# Patient Record
Sex: Female | Born: 1980 | Race: White | Hispanic: No | Marital: Married | Smoking: Never smoker
Health system: Southern US, Community
[De-identification: ages and names within clinical notes are randomized; demographics above are authoritative.]

## PROBLEM LIST (undated history)

## (undated) ENCOUNTER — Emergency Department (HOSPITAL_COMMUNITY): Payer: Medicaid Other

## (undated) ENCOUNTER — Inpatient Hospital Stay (HOSPITAL_COMMUNITY): Payer: Self-pay

## (undated) DIAGNOSIS — O99810 Abnormal glucose complicating pregnancy: Secondary | ICD-10-CM

## (undated) DIAGNOSIS — O34219 Maternal care for unspecified type scar from previous cesarean delivery: Secondary | ICD-10-CM

## (undated) DIAGNOSIS — Z98891 History of uterine scar from previous surgery: Secondary | ICD-10-CM

## (undated) DIAGNOSIS — S92515A Nondisplaced fracture of proximal phalanx of left lesser toe(s), initial encounter for closed fracture: Secondary | ICD-10-CM

## (undated) DIAGNOSIS — O4690 Antepartum hemorrhage, unspecified, unspecified trimester: Secondary | ICD-10-CM

## (undated) DIAGNOSIS — D649 Anemia, unspecified: Secondary | ICD-10-CM

## (undated) DIAGNOSIS — O09529 Supervision of elderly multigravida, unspecified trimester: Secondary | ICD-10-CM

## (undated) HISTORY — DX: Nondisplaced fracture of proximal phalanx of left lesser toe(s), initial encounter for closed fracture: S92.515A

## (undated) HISTORY — DX: Maternal care for unspecified type scar from previous cesarean delivery: O34.219

## (undated) HISTORY — DX: Anemia, unspecified: D64.9

## (undated) HISTORY — DX: Abnormal glucose complicating pregnancy: O99.810

## (undated) HISTORY — DX: History of uterine scar from previous surgery: Z98.891

## (undated) HISTORY — DX: Antepartum hemorrhage, unspecified, unspecified trimester: O46.90

## (undated) HISTORY — DX: Supervision of elderly multigravida, unspecified trimester: O09.529

---

## 2005-07-14 ENCOUNTER — Emergency Department (HOSPITAL_COMMUNITY): Admission: EM | Admit: 2005-07-14 | Discharge: 2005-07-14 | Payer: Self-pay | Admitting: Emergency Medicine

## 2005-07-26 ENCOUNTER — Ambulatory Visit (HOSPITAL_COMMUNITY): Admission: RE | Admit: 2005-07-26 | Discharge: 2005-07-26 | Payer: Self-pay | Admitting: Obstetrics & Gynecology

## 2007-01-01 ENCOUNTER — Ambulatory Visit (HOSPITAL_COMMUNITY): Admission: RE | Admit: 2007-01-01 | Discharge: 2007-01-01 | Payer: Self-pay | Admitting: Obstetrics & Gynecology

## 2007-03-26 ENCOUNTER — Inpatient Hospital Stay (HOSPITAL_COMMUNITY): Admission: AD | Admit: 2007-03-26 | Discharge: 2007-03-30 | Payer: Self-pay | Admitting: Obstetrics & Gynecology

## 2009-10-16 ENCOUNTER — Emergency Department (HOSPITAL_COMMUNITY): Admission: EM | Admit: 2009-10-16 | Discharge: 2009-10-16 | Payer: Self-pay | Admitting: Emergency Medicine

## 2009-12-05 DIAGNOSIS — E559 Vitamin D deficiency, unspecified: Secondary | ICD-10-CM | POA: Insufficient documentation

## 2010-08-29 LAB — URINALYSIS, ROUTINE W REFLEX MICROSCOPIC
Glucose, UA: NEGATIVE mg/dL
Ketones, ur: NEGATIVE mg/dL
Nitrite: NEGATIVE
Protein, ur: NEGATIVE mg/dL
Specific Gravity, Urine: 1.003 — ABNORMAL LOW (ref 1.005–1.030)
Urobilinogen, UA: 0.2 mg/dL (ref 0.0–1.0)

## 2010-08-29 LAB — URINE MICROSCOPIC-ADD ON

## 2010-08-29 LAB — WET PREP, GENITAL
Trich, Wet Prep: NONE SEEN
Yeast Wet Prep HPF POC: NONE SEEN

## 2010-10-24 NOTE — H&P (Signed)
NAMEWELDA, Glenn             ACCOUNT NO.:  [id number]   MEDICAL RECORD NO.:  [id number]          PATIENT TYPE:  INP   LOCATION:  9169                          FACILITY:  [facility]   PHYSICIAN:  Roseanna Rainbow, M.D.DATE OF BIRTH:  1981/05/27   DATE OF ADMISSION:  03/26/2007  DATE OF DISCHARGE:                              HISTORY & PHYSICAL   CHIEF COMPLAINT:  The patient is a 30 year old para 0 with an estimated  date of confinement of April 02, 2007 with an intrauterine pregnancy  at 39 weeks with borderline glucose tolerance for induction of labor.   HISTORY OF PRESENT ILLNESS:  Please see the above. The 1-hour GCT was  elevated, and her initial passing CBG was 95. Two-hour postprandials  were in the 120 range.   ALLERGIES:  NO KNOWN DRUG ALLERGIES.     Prenatal screening chlamydia probe negative.  Urine culture and  sensitivity on August 22, 2006. pap smear negative, GC probe negative,  hematocrit 39.7, hemoglobin 12.7, HIV nonreactive, platelets 217,000,  blood type A+, RPR nonreactive, rubella immune, sickle cell negative.  Her OB risk factors please see the above urinary tract infection.  On  prenatal screening GBS negative on October 2.   PAST GYN HISTORY:  Noncontributory.   PAST MEDICAL HISTORY:  She denies past surgical history.   SOCIAL HISTORY:  She is homemaker.  She is married, living with her  spouse.  She Bowels was not given exam was significant history of  alcohol usage. Has no significant smoking history.  Denies illicit drug  use.   FAMILY HISTORY:  No major illnesses known.   PHYSICAL EXAMINATION:  VITAL SIGNS:  Stable.  Afebrile.  Fetal heart  tracing reassuring.  Tocodynamometer regular uterine contractions.  Sterile vaginal exam per the RN.   ASSESSMENTS:  Intrauterine pregnancy at 39 weeks. Borderline glucose  tolerance and fetal heart tracing consistent with fetal well-being.   PLAN:  Admission.  Two-stage induction of labor.      Roseanna Rainbow, M.D.  Electronically Signed     LAJ/MEDQ  D:  03/27/2007  T:  03/27/2007  Job:  [id number]

## 2010-10-24 NOTE — Op Note (Signed)
[name], Morgan Glenn             ACCOUNT NO.:  [id number]   MEDICAL RECORD NO.:  [id number]          PATIENT TYPE:  INP   LOCATION:  9134                          FACILITY:  [facility]   PHYSICIAN:  Roseanna Rainbow, M.D.DATE OF BIRTH:  [birth year]   DATE OF PROCEDURE:  03/26/2007  DATE OF DISCHARGE:                               OPERATIVE REPORT   PREOPERATIVE DIAGNOSIS:  Intrauterine pregnancy at term, arrest of  descent.   POSTOPERATIVE DIAGNOSIS:  Intrauterine pregnancy at term, arrest of  descent, persistent occiput posterior.   SURGEON:  Roseanna Rainbow, M.D.  Charles A. Clearance Coots, M.D.   ANESTHESIA:  Epidural.   ESTIMATED BLOOD LOSS:  600 mL.   FLUIDS:  2400 mL lactated Ringer's.   URINE OUTPUT:  200 mL blood tinged urine.   COMPLICATIONS:  None.   PROCEDURE:  The patient was taken to the operating room with an IV  running and an epidural catheter in situ.  She was placed in the dorsal  supine position with a leftward tilt and prepped and draped in the usual  sterile fashion.  After a time out had been completed, a Pfannenstiel  skin incision was then made with the scalpel and carried down to the  underlying fascia.  The fascia was nicked in the midline.  The fascial  incision was then extended bilaterally.  The superior aspect of the  fascial incision was tented up and the underlying rectus muscles  dissected off.  The inferior aspect of the fascial incision was  manipulated in a similar fashion.  The rectus muscles were separated in  the midline.  The parietal peritoneum was then entered bluntly.  This  incision was then extended superiorly and inferiorly with good  visualization of the bladder.  An Alexis retractor was then placed into  the incision.  The vesicouterine peritoneum was tented up and entered  sharply.  This incision was then extended bilaterally and the bladder  flap created bluntly.  The lower uterine segment was then incised in a  transverse  fashion with the scalpel.  This incision was then extended  bluntly.   The infant's head was then delivered atraumatically.  The oropharynx was  suctioned with bulb suction.  The cord was clamped and cut.  The infant  was handed off to the awaiting neonatologist.  The placenta was then  removed.  The intrauterine cavity was evacuated of any remaining  amniotic fluid, clots and debris with a moist laparotomy sponge.  The  uterine incision was then reapproximated in a running interlocking  fashion using 0 Monocryl.  A second suture of the same was then placed  as an imbricating layer.  Adequate hemostasis was noted.  The paracolic  gutters were then copiously irrigated.  The retractor was then removed.  The parietal peritoneum was reapproximated in a running fashion using 2-  0 Vicryl.  The fascia was closed in a running fashion using 0 Vicryl.  The skin was closed with staples.  At the close of the procedure, the  instrument and pack counts were said to be correct x2.  1 gram of  cephazolin had been given at cord clamp.  The patient was taken to the  PACU awake and in stable condition.      Roseanna Rainbow, M.D.  Electronically Signed     LAJ/MEDQ  D:  03/27/2007  T:  03/28/2007  Job:  [id number]

## 2010-10-24 NOTE — Discharge Summary (Signed)
Morgan Glenn, Morgan Glenn             ACCOUNT NO.:  [id number]   MEDICAL RECORD NO.:  [id number]          PATIENT TYPE:  INP   LOCATION:  9134                          [facility]ILITY:  [facility]   PHYSICIAN:  Charles A. Clearance Coots, M.D.DATE OF BIRTH:  [birth year]   DATE OF ADMISSION:  03/26/2007  DATE OF DISCHARGE:  03/30/2007                               DISCHARGE SUMMARY   ADMISSION DIAGNOSES:  1. [redacted] weeks gestation.  2. Borderline glucose tolerance.  3. Two-stage induction of labor.   DISCHARGE DIAGNOSES:  1. [redacted] weeks gestation.  2. Borderline glucose tolerance.  3. Two-stage induction of labor.  4. Status post primary low transverse cesarean section on March 27, 2007 for arrest of descent.  Viable female was delivered at 1851.      Apgars of 7 at 1 minute and 9 at 5 minutes, weight of 2948 grams,      length of 55 cm.  Mother and infant discharged home in good      condition.   REASON FOR ADMISSION:  30 year old para 0 with estimated date of  confinement of April 02, 2007 who presents for two-stage induction of  labor.  The patient had borderline glucose tolerance testing.  The one-  hour glucose tolerance level was elevated, and her initial fasting  capillary blood sugar was 95, and her 2-hour postprandials were in the  120 range.   PAST MEDICAL HISTORY:  Surgery none.  Illnesses none.   MEDICATIONS:  Prenatal vitamins.   ALLERGIES:  NO KNOWN DRUG ALLERGIES   SOCIAL HISTORY:  Married.  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  Negative history of tobacco, alcohol, or recreational drug use.   FAMILY HISTORY:  No major illnesses known.   PHYSICAL EXAMINATION:  GENERAL:  Well-nourished, well-developed female  in no acute distress.  VITAL SIGNS:  She is afebrile, vital signs were stable.  LUNGS:  Clear to auscultation bilaterally.  HEART:  Regular rate and rhythm.  ABDOMEN:  Gravid, nontender.  PELVIC:  Cervix long, closed, and vertex at -2 station.   ADMITTING LABORATORY  VALUES:  Hemoglobin 11.8, hematocrit 34.3, white  blood cell count 10,000, platelets 209,000.  Comprehensive metabolic  panel was within normal limits.  Urinalysis was within normal limits.   HOSPITAL COURSE:  The patient was admitted for two-stage induction of  labor.  She received Cervidil cervical ripening followed by Pitocin.  The patient progressed to full dilatation, but the vertex descended to  no more than a +1 station and made no further descent.  A decision was  made to proceed to cesarean section delivery for arrest of descent.  Primary low transverse cesarean section was performed on March 27, 2007.  There were no intraoperative complications.  Postoperative course  was uncomplicated.  The patient was discharged home on postop day #3 in  good condition.   DISCHARGE LABORATORY VALUES:  Hemoglobin 10.9, hematocrit 31.9, white  blood cell count 12,700, platelets 152,000.  Urine culture was negative.   DISCHARGE MEDICATIONS:  Tylox and ibuprofen was prescribed for pain.  Continue prenatal vitamins.   DISCHARGE  INSTRUCTIONS:  Routine written instructions were given for  discharge after cesarean section.  The patient is to call the office for  a followup appointment in 2 weeks.      Charles A. Clearance Coots, M.D.  Electronically Signed     CAH/MEDQ  D:  03/30/2007  T:  03/31/2007  Job:  [id number]

## 2010-11-27 ENCOUNTER — Emergency Department (HOSPITAL_COMMUNITY): Payer: Medicaid Other

## 2010-11-27 ENCOUNTER — Emergency Department (HOSPITAL_COMMUNITY)
Admission: EM | Admit: 2010-11-27 | Discharge: 2010-11-28 | Disposition: A | Discharge status: Home / Self Care | Payer: Medicaid Other | Attending: Emergency Medicine | Admitting: Emergency Medicine

## 2010-11-27 ENCOUNTER — Other Ambulatory Visit (HOSPITAL_COMMUNITY): Payer: Medicaid Other

## 2010-11-27 DIAGNOSIS — O209 Hemorrhage in early pregnancy, unspecified: Secondary | ICD-10-CM | POA: Insufficient documentation

## 2010-11-27 DIAGNOSIS — R1031 Right lower quadrant pain: Secondary | ICD-10-CM | POA: Insufficient documentation

## 2010-11-27 LAB — URINALYSIS, ROUTINE W REFLEX MICROSCOPIC
Bilirubin Urine: NEGATIVE
Specific Gravity, Urine: 1.005 (ref 1.005–1.030)
pH: 6 (ref 5.0–8.0)

## 2010-11-27 LAB — POCT I-STAT, CHEM 8
BUN: 11 mg/dL (ref 6–23)
Chloride: 103 mEq/L (ref 96–112)
Glucose, Bld: 142 mg/dL — ABNORMAL HIGH (ref 70–99)
HCT: 39 % (ref 36.0–46.0)
Potassium: 4.1 mEq/L (ref 3.5–5.1)
Sodium: 138 mEq/L (ref 135–145)

## 2010-11-27 LAB — DIFFERENTIAL
Basophils Relative: 0 % (ref 0–1)
Lymphs Abs: 3.4 10*3/uL (ref 0.7–4.0)
Monocytes Absolute: 0.8 10*3/uL (ref 0.1–1.0)
Monocytes Relative: 7 % (ref 3–12)
Neutrophils Relative %: 59 % (ref 43–77)

## 2010-11-27 LAB — CBC
HCT: 36.7 % (ref 36.0–46.0)
MCHC: 33.5 g/dL (ref 30.0–36.0)
MCV: 84.8 fL (ref 78.0–100.0)
RDW: 13.4 % (ref 11.5–15.5)

## 2010-11-27 LAB — URINE MICROSCOPIC-ADD ON

## 2010-11-27 LAB — WET PREP, GENITAL: Yeast Wet Prep HPF POC: NONE SEEN

## 2010-11-28 LAB — GC/CHLAMYDIA PROBE AMP, GENITAL
Chlamydia, DNA Probe: NEGATIVE
GC Probe Amp, Genital: NEGATIVE

## 2010-11-28 LAB — HCG, QUANTITATIVE, PREGNANCY: hCG, Beta Chain, Quant, S: 1506 m[IU]/mL — ABNORMAL HIGH (ref <5)

## 2010-11-30 ENCOUNTER — Inpatient Hospital Stay (HOSPITAL_COMMUNITY)
Admission: AD | Admit: 2010-11-30 | Discharge: 2010-11-30 | Disposition: A | Discharge status: Home / Self Care | Payer: Medicaid Other | Source: Ambulatory Visit | Attending: Obstetrics & Gynecology | Admitting: Obstetrics & Gynecology

## 2010-11-30 DIAGNOSIS — O3680X0 Pregnancy with inconclusive fetal viability, not applicable or unspecified: Secondary | ICD-10-CM

## 2010-11-30 DIAGNOSIS — Z3480 Encounter for supervision of other normal pregnancy, unspecified trimester: Secondary | ICD-10-CM | POA: Insufficient documentation

## 2010-12-12 ENCOUNTER — Ambulatory Visit (HOSPITAL_COMMUNITY)
Admission: RE | Admit: 2010-12-12 | Discharge: 2010-12-12 | Disposition: A | Discharge status: Home / Self Care | Payer: Medicaid Other | Source: Ambulatory Visit | Attending: Obstetrics & Gynecology | Admitting: Obstetrics & Gynecology

## 2010-12-12 ENCOUNTER — Ambulatory Visit (HOSPITAL_COMMUNITY): Payer: Medicaid Other

## 2010-12-12 DIAGNOSIS — O99891 Other specified diseases and conditions complicating pregnancy: Secondary | ICD-10-CM | POA: Insufficient documentation

## 2010-12-12 DIAGNOSIS — R1031 Right lower quadrant pain: Secondary | ICD-10-CM | POA: Insufficient documentation

## 2010-12-12 DIAGNOSIS — Z3689 Encounter for other specified antenatal screening: Secondary | ICD-10-CM | POA: Insufficient documentation

## 2011-03-21 LAB — CBC
HCT: 31.9 — ABNORMAL LOW
Hemoglobin: 10.9 — ABNORMAL LOW
Hemoglobin: 12.7
Hemoglobin: 12.8
MCHC: 33.5
MCHC: 33.6
MCHC: 34.4
MCV: 84.6
MCV: 84.7
Platelets: 209
RBC: 3.77 — ABNORMAL LOW
RBC: 4.44
RDW: 16.1 — ABNORMAL HIGH
RDW: 16.4 — ABNORMAL HIGH
WBC: 12.7 — ABNORMAL HIGH
WBC: 14.5 — ABNORMAL HIGH

## 2011-03-21 LAB — URINE CULTURE
Colony Count: NO GROWTH
Culture: NO GROWTH

## 2011-03-21 LAB — URINALYSIS, ROUTINE W REFLEX MICROSCOPIC
Glucose, UA: 100 — AB
Ketones, ur: NEGATIVE
Protein, ur: NEGATIVE
pH: 6

## 2011-03-21 LAB — PLATELET COUNT: Platelets: 173

## 2011-03-21 LAB — COMPREHENSIVE METABOLIC PANEL
ALT: 12
Calcium: 8.7
Creatinine, Ser: 0.5
GFR calc non Af Amer: >60
Glucose, Bld: 107 — ABNORMAL HIGH
Sodium: 137
Total Protein: 6.1

## 2011-03-21 LAB — URINE MICROSCOPIC-ADD ON: RBC / HPF: NONE SEEN

## 2011-03-21 LAB — LACTATE DEHYDROGENASE: LDH: 144

## 2011-03-21 LAB — URIC ACID: Uric Acid, Serum: 5

## 2011-07-04 ENCOUNTER — Inpatient Hospital Stay (HOSPITAL_COMMUNITY): Payer: Medicaid Other | Admitting: Anesthesiology

## 2011-07-04 ENCOUNTER — Encounter (HOSPITAL_COMMUNITY): Payer: Self-pay | Admitting: Anesthesiology

## 2011-07-04 ENCOUNTER — Encounter (HOSPITAL_COMMUNITY): Payer: Self-pay | Admitting: *Deleted

## 2011-07-04 ENCOUNTER — Inpatient Hospital Stay (HOSPITAL_COMMUNITY)
Admission: AD | Admit: 2011-07-04 | Discharge: 2011-07-06 | DRG: 774 | Disposition: A | Discharge status: Home / Self Care | Payer: Medicaid Other | Source: Ambulatory Visit | Attending: Obstetrics and Gynecology | Admitting: Obstetrics and Gynecology

## 2011-07-04 ENCOUNTER — Other Ambulatory Visit: Payer: Self-pay

## 2011-07-04 DIAGNOSIS — O368390 Maternal care for abnormalities of the fetal heart rate or rhythm, unspecified trimester, not applicable or unspecified: Secondary | ICD-10-CM | POA: Diagnosis not present

## 2011-07-04 DIAGNOSIS — O4290 Premature rupture of membranes, unspecified as to length of time between rupture and onset of labor, unspecified weeks of gestation: Secondary | ICD-10-CM | POA: Diagnosis present

## 2011-07-04 DIAGNOSIS — Z98891 History of uterine scar from previous surgery: Secondary | ICD-10-CM

## 2011-07-04 DIAGNOSIS — O4590 Premature separation of placenta, unspecified, unspecified trimester: Secondary | ICD-10-CM | POA: Diagnosis present

## 2011-07-04 DIAGNOSIS — Z3490 Encounter for supervision of normal pregnancy, unspecified, unspecified trimester: Secondary | ICD-10-CM

## 2011-07-04 DIAGNOSIS — O34219 Maternal care for unspecified type scar from previous cesarean delivery: Secondary | ICD-10-CM | POA: Diagnosis present

## 2011-07-04 LAB — RPR: RPR: NONREACTIVE

## 2011-07-04 LAB — STREP B DNA PROBE

## 2011-07-04 LAB — CBC
HCT: 35.1 % — ABNORMAL LOW (ref 36.0–46.0)
MCH: 30.1 pg (ref 26.0–34.0)
MCHC: 33.3 g/dL (ref 30.0–36.0)
MCV: 90.2 fL (ref 78.0–100.0)
Platelets: 155 10*3/uL (ref 150–400)
RDW: 14.5 % (ref 11.5–15.5)

## 2011-07-04 LAB — HEPATITIS B SURFACE ANTIGEN: Hepatitis B Surface Ag: NEGATIVE

## 2011-07-04 LAB — POCT FERN TEST: Fern Test: POSITIVE

## 2011-07-04 MED ORDER — SENNOSIDES-DOCUSATE SODIUM 8.6-50 MG PO TABS
2.0000 | ORAL_TABLET | Freq: Every day | ORAL | Status: DC
  Administered 2011-07-04 – 2011-07-05 (×2): 2 via ORAL

## 2011-07-04 MED ORDER — DEXTROSE 5 % IV SOLN
2.5000 10*6.[IU] | INTRAVENOUS | Status: DC
  Administered 2011-07-04 (×3): 2.5 10*6.[IU] via INTRAVENOUS
  Filled 2011-07-04 (×6): qty 2.5

## 2011-07-04 MED ORDER — ONDANSETRON HCL 4 MG/2ML IJ SOLN: 4.0000 mg | Freq: Four times a day (QID) | INTRAMUSCULAR | Status: DC | PRN

## 2011-07-04 MED ORDER — MEDROXYPROGESTERONE ACETATE 150 MG/ML IM SUSP: 150.0000 mg | INTRAMUSCULAR | Status: DC | PRN

## 2011-07-04 MED ORDER — IBUPROFEN 600 MG PO TABS: 600.0000 mg | ORAL_TABLET | Freq: Four times a day (QID) | ORAL | Status: DC | PRN

## 2011-07-04 MED ORDER — OXYCODONE-ACETAMINOPHEN 5-325 MG PO TABS: 2.0000 | ORAL_TABLET | ORAL | Status: DC | PRN

## 2011-07-04 MED ORDER — POLYSACCHARIDE IRON 150 MG PO CAPS
150.0000 mg | ORAL_CAPSULE | Freq: Two times a day (BID) | ORAL | Status: DC
  Administered 2011-07-05 – 2011-07-06 (×3): 150 mg via ORAL
  Filled 2011-07-04 (×4): qty 1

## 2011-07-04 MED ORDER — BENZOCAINE-MENTHOL 20-0.5 % EX AERO
INHALATION_SPRAY | CUTANEOUS | Status: AC
  Administered 2011-07-04: 1 via TOPICAL
  Filled 2011-07-04: qty 56

## 2011-07-04 MED ORDER — PENICILLIN G POTASSIUM 5000000 UNITS IJ SOLR
5.0000 10*6.[IU] | Freq: Once | INTRAVENOUS | Status: AC
  Administered 2011-07-04: 5 10*6.[IU] via INTRAVENOUS
  Filled 2011-07-04: qty 5

## 2011-07-04 MED ORDER — ZOLPIDEM TARTRATE 5 MG PO TABS: 5.0000 mg | ORAL_TABLET | Freq: Every evening | ORAL | Status: DC | PRN

## 2011-07-04 MED ORDER — EPHEDRINE 5 MG/ML INJ: 10.0000 mg | INTRAVENOUS | Status: DC | PRN

## 2011-07-04 MED ORDER — WITCH HAZEL-GLYCERIN EX PADS: 1.0000 "application " | MEDICATED_PAD | CUTANEOUS | Status: DC | PRN

## 2011-07-04 MED ORDER — ONDANSETRON HCL 4 MG/2ML IJ SOLN: 4.0000 mg | INTRAMUSCULAR | Status: DC | PRN

## 2011-07-04 MED ORDER — PRENATAL MULTIVITAMIN CH
1.0000 | ORAL_TABLET | Freq: Every day | ORAL | Status: DC
  Administered 2011-07-05 – 2011-07-06 (×2): 1 via ORAL
  Filled 2011-07-04 (×2): qty 1

## 2011-07-04 MED ORDER — ONDANSETRON HCL 4 MG PO TABS: 4.0000 mg | ORAL_TABLET | ORAL | Status: DC | PRN

## 2011-07-04 MED ORDER — METHYLERGONOVINE MALEATE 0.2 MG/ML IJ SOLN: 0.2000 mg | INTRAMUSCULAR | Status: DC | PRN

## 2011-07-04 MED ORDER — LIDOCAINE HCL (PF) 1 % IJ SOLN: 30.0000 mL | INTRAMUSCULAR | Status: DC | PRN

## 2011-07-04 MED ORDER — OXYTOCIN 10 UNIT/ML IJ SOLN: 10.0000 [IU] | Freq: Once | INTRAMUSCULAR | Status: DC

## 2011-07-04 MED ORDER — EPHEDRINE 5 MG/ML INJ
10.0000 mg | INTRAVENOUS | Status: DC | PRN
  Filled 2011-07-04: qty 4

## 2011-07-04 MED ORDER — MAGNESIUM HYDROXIDE 400 MG/5ML PO SUSP: 30.0000 mL | ORAL | Status: DC | PRN

## 2011-07-04 MED ORDER — PENICILLIN G POTASSIUM 5000000 UNITS IJ SOLR
2.5000 10*6.[IU] | INTRAVENOUS | Status: DC
  Filled 2011-07-04 (×4): qty 2.5

## 2011-07-04 MED ORDER — LANOLIN HYDROUS EX OINT: TOPICAL_OINTMENT | CUTANEOUS | Status: DC | PRN

## 2011-07-04 MED ORDER — METHYLERGONOVINE MALEATE 0.2 MG PO TABS: 0.2000 mg | ORAL_TABLET | ORAL | Status: DC | PRN

## 2011-07-04 MED ORDER — BENZOCAINE-MENTHOL 20-0.5 % EX AERO
1.0000 "application " | INHALATION_SPRAY | CUTANEOUS | Status: DC | PRN
  Administered 2011-07-04 – 2011-07-05 (×2): 1 via TOPICAL

## 2011-07-04 MED ORDER — LACTATED RINGERS IV SOLN
INTRAVENOUS | Status: DC
  Administered 2011-07-04 (×3): via INTRAVENOUS

## 2011-07-04 MED ORDER — FENTANYL 2.5 MCG/ML BUPIVACAINE 1/10 % EPIDURAL INFUSION (WH - ANES)
INTRAMUSCULAR | Status: DC | PRN
  Administered 2011-07-04: 13 mL/h via EPIDURAL

## 2011-07-04 MED ORDER — SIMETHICONE 80 MG PO CHEW: 80.0000 mg | CHEWABLE_TABLET | ORAL | Status: DC | PRN

## 2011-07-04 MED ORDER — TERBUTALINE SULFATE 1 MG/ML IJ SOLN: 0.2500 mg | Freq: Once | INTRAMUSCULAR | Status: DC | PRN

## 2011-07-04 MED ORDER — LACTATED RINGERS IV SOLN: 500.0000 mL | Freq: Once | INTRAVENOUS | Status: DC

## 2011-07-04 MED ORDER — OXYTOCIN 20 UNITS IN LACTATED RINGERS INFUSION - SIMPLE
1.0000 m[IU]/min | INTRAVENOUS | Status: DC
  Administered 2011-07-04: 2 m[IU]/min via INTRAVENOUS

## 2011-07-04 MED ORDER — ACETAMINOPHEN 325 MG PO TABS: 650.0000 mg | ORAL_TABLET | ORAL | Status: DC | PRN

## 2011-07-04 MED ORDER — OXYCODONE-ACETAMINOPHEN 5-325 MG PO TABS
1.0000 | ORAL_TABLET | ORAL | Status: DC | PRN
  Administered 2011-07-05: 1 via ORAL
  Filled 2011-07-04: qty 1

## 2011-07-04 MED ORDER — DIPHENHYDRAMINE HCL 50 MG/ML IJ SOLN: 12.5000 mg | INTRAMUSCULAR | Status: DC | PRN

## 2011-07-04 MED ORDER — LACTATED RINGERS IV SOLN
500.0000 mL | INTRAVENOUS | Status: DC | PRN
  Administered 2011-07-04: 1000 mL via INTRAVENOUS

## 2011-07-04 MED ORDER — SODIUM BICARBONATE 8.4 % IV SOLN
INTRAVENOUS | Status: DC | PRN
  Administered 2011-07-04: 4 mL via EPIDURAL

## 2011-07-04 MED ORDER — OXYTOCIN BOLUS FROM INFUSION
500.0000 mL | Freq: Once | INTRAVENOUS | Status: DC
  Filled 2011-07-04: qty 500

## 2011-07-04 MED ORDER — FENTANYL 2.5 MCG/ML BUPIVACAINE 1/10 % EPIDURAL INFUSION (WH - ANES)
14.0000 mL/h | INTRAMUSCULAR | Status: DC
  Filled 2011-07-04: qty 60

## 2011-07-04 MED ORDER — CITRIC ACID-SODIUM CITRATE 334-500 MG/5ML PO SOLN
30.0000 mL | ORAL | Status: DC | PRN
  Filled 2011-07-04: qty 15

## 2011-07-04 MED ORDER — IBUPROFEN 600 MG PO TABS
600.0000 mg | ORAL_TABLET | Freq: Four times a day (QID) | ORAL | Status: DC
  Administered 2011-07-04 – 2011-07-06 (×7): 600 mg via ORAL
  Filled 2011-07-04 (×7): qty 1

## 2011-07-04 MED ORDER — PHENYLEPHRINE 40 MCG/ML (10ML) SYRINGE FOR IV PUSH (FOR BLOOD PRESSURE SUPPORT)
80.0000 ug | PREFILLED_SYRINGE | INTRAVENOUS | Status: DC | PRN
  Filled 2011-07-04: qty 5

## 2011-07-04 MED ORDER — PHENYLEPHRINE 40 MCG/ML (10ML) SYRINGE FOR IV PUSH (FOR BLOOD PRESSURE SUPPORT): 80.0000 ug | PREFILLED_SYRINGE | INTRAVENOUS | Status: DC | PRN

## 2011-07-04 MED ORDER — OXYTOCIN 20 UNITS IN LACTATED RINGERS INFUSION - SIMPLE
125.0000 mL/h | Freq: Once | INTRAVENOUS | Status: DC
  Filled 2011-07-04: qty 1000

## 2011-07-04 MED ORDER — DIPHENHYDRAMINE HCL 25 MG PO CAPS: 25.0000 mg | ORAL_CAPSULE | Freq: Four times a day (QID) | ORAL | Status: DC | PRN

## 2011-07-04 MED ORDER — TETANUS-DIPHTH-ACELL PERTUSSIS 5-2.5-18.5 LF-MCG/0.5 IM SUSP
0.5000 mL | Freq: Once | INTRAMUSCULAR | Status: AC
Start: 2011-07-05 — End: 2011-07-05
  Administered 2011-07-05: 0.5 mL via INTRAMUSCULAR
  Filled 2011-07-04: qty 0.5

## 2011-07-04 MED ORDER — DIBUCAINE 1 % RE OINT: 1.0000 "application " | TOPICAL_OINTMENT | RECTAL | Status: DC | PRN

## 2011-07-04 NOTE — Progress Notes (Signed)
Morgan Glenn is a 31 y.o. G2P1001 at [redacted]w[redacted]d  admitted for PROM  Subjective: On arrival to room, pt resting on left side and husband at bedside.  Nothing to eat this morning.  No c/o's.  Does report some vaginal bleeding.  GFM.  Denies incision pain.    Objective: BP 103/59  Pulse 80  Temp(Src) 98.2 F (36.8 C) (Oral)  Resp 16  Ht 5\' 3"  (1.6 m)  Wt 68.947 kg (152 lb)  BMI 26.93 kg/m2      FHT:  FHR: 135 bpm, variability: moderate,  accelerations:  Present,  decelerations:  Absent UC:   irregular, every 5-7 minutes SVE:   Dilation: 2.5 Effacement (%): 60 Station: -3 Exam by:: H Rodriguez Aguinaldo CNM Could not determine presenting part, therefore bedside u/s done and Vtx noted.  Inlet adequate, but outlet questionable. Labs: Lab Results  Component Value Date   WBC 8.4 07/04/2011   HGB 11.7* 07/04/2011   HCT 35.1* 07/04/2011   MCV 90.2 07/04/2011   PLT 155 07/04/2011    Assessment / Plan: 1.  Preterm, Prelabor ROM at 36.4  2.  Desires TOLAC  3.  GBS unknown, but receiving PCN  4.  Lang barrier  5.  No cx change  6.  Blood show despite lack of cx change  Labor: latent Preeclampsia:  no signs or symptoms of toxicity Fetal Wellbeing:  Category I Pain Control:  Labor support without medications I/D:  n/a Anticipated MOD:  NSVD 1.  Rec'd Pitocin to induce labor.  R/B/A disc'd and pt agreeable to proceed.  2.  Desires epidural in labor.   3.  C/w MD prn. Ivett Luebbe H 07/04/2011, 9:37 AM

## 2011-07-04 NOTE — Anesthesia Preprocedure Evaluation (Signed)
Anesthesia Evaluation  Patient identified by MRN, date of birth, ID band Patient awake    Reviewed: Allergy & Precautions, H&P , Patient's Chart, lab work & pertinent test results  Airway Mallampati: II TM Distance: >3 FB Neck ROM: full    Dental  (+) Teeth Intact   Pulmonary  clear to auscultation        Cardiovascular regular Normal    Neuro/Psych    GI/Hepatic   Endo/Other  Diabetes mellitus-, Gestational  Renal/GU      Musculoskeletal   Abdominal   Peds  Hematology   Anesthesia Other Findings       Reproductive/Obstetrics (+) Pregnancy                          Anesthesia Physical Anesthesia Plan  ASA: III  Anesthesia Plan: Epidural   Post-op Pain Management:    Induction:   Airway Management Planned:   Additional Equipment:   Intra-op Plan:   Post-operative Plan:   Informed Consent: I have reviewed the patients History and Physical, chart, labs and discussed the procedure including the risks, benefits and alternatives for the proposed anesthesia with the patient or authorized representative who has indicated his/her understanding and acceptance.   Dental Advisory Given  Plan Discussed with:   Anesthesia Plan Comments: (Labs checked- platelets confirmed with RN in room. Fetal heart tracing, per RN, reported to be stable enough for sitting procedure. Discussed epidural, and patient consents to the procedure:  included risk of possible headache,backache, failed block, allergic reaction, and nerve injury. This patient was asked if she had any questions or concerns before the procedure started. )      Anesthesia Quick Evaluation  

## 2011-07-04 NOTE — Anesthesia Procedure Notes (Signed)

## 2011-07-04 NOTE — H&P (Signed)
Morgan Glenn is a 31 y.o. female presenting for ROM. Arrived via EMS with ROM at 0100 with clear fluid, has had irregular ctx since than. Denies VB, +FM. Hx of prior c-section, desires TOLAC.  Pregnancy significant for:  1. Prev c/s desires TOLAC 2. 1st trimester blding 3. Language barrier  HPI: pt began prenatal care at [facility] at 10wks with EDC of 07/28/11. Had 1 MAU visit prior for bleeding, which resolved. She had a normal quad screen and normal anatomy scan at 18wks. Normal gtt at 26wks, with hgb of 10.1 and FE supplement was recommended.   Maternal Medical History:  Reason for admission: Reason for admission: rupture of membranes.  Contractions: Onset was 1-2 hours ago.   Frequency: irregular.   Duration is approximately 60 seconds.   Perceived severity is mild.    Fetal activity: Perceived fetal activity is normal.    Prenatal complications: no prenatal complications   OB History    Grav Para Term Preterm Abortions TAB SAB Ect Mult Living   2 1 1  0 0 0 0 0 0 1     History reviewed. No pertinent past medical history. Past Surgical History  Procedure Date  . Cesarean section    Family History: family history is not on file. Diabetes: mother, sister, father  Social History:  reports that she has never smoked. She does not have any smokeless tobacco history on file. She reports that she does not drink alcohol or use illicit drugs. Pt is MWF of Islamic faith, HS education, unemployed.   Review of Systems  All other systems reviewed and are negative.    Dilation: 3 Effacement (%): 60 Station: -2 Exam by:: Humphrey Rolls, RN Blood pressure 107/65, pulse 82, temperature 97.8 F (36.6 C), temperature source Oral, resp. rate 18, height 5\' 3"  (1.6 Glenn), weight 68.947 kg (152 lb). Maternal Exam:  Uterine Assessment: Contraction strength is mild.  Contraction frequency is irregular.   Abdomen: Patient reports no abdominal tenderness. Fundal height is aga.   Estimated fetal  weight is 5-6.   Fetal presentation: vertex  Introitus: Normal vulva. Normal vagina.  Ferning test: positive.  Amniotic fluid character: clear.  Pelvis: adequate for delivery.   Cervix: Cervix evaluated by digital exam.     Fetal Exam Fetal Monitor Review: Mode: ultrasound.   Baseline rate: 130.  Variability: moderate (6-25 bpm).   Pattern: accelerations present and no decelerations.    Fetal State Assessment: Category I - tracings are normal.     Physical Exam  Nursing note and vitals reviewed. Constitutional: She is oriented to person, place, and time. She appears well-developed and well-nourished.  HENT:  Head: Normocephalic.  Neck: Normal range of motion.  Cardiovascular: Normal rate and normal heart sounds.   Respiratory: Effort normal and breath sounds normal.  GI: Soft.  Genitourinary: Vagina normal.  Musculoskeletal: Normal range of motion. She exhibits no edema.  Neurological: She is alert and oriented to person, place, and time.  Skin: Skin is warm and dry.  Psychiatric: She has a normal mood and affect. Her behavior is normal.    Prenatal labs: ABO, Rh: --/--/A POS (06/18 2200) Antibody:  neg Rubella:  Imm RPR:   NR HBsAg:   neg HIV:   neg GBS:   unknown Pap/GC/CT - neg Quad normal 1hr gtt normal - hgb 10.1, RPR NR at 26wks  Assessment/Plan: IUP at [redacted]w[redacted]d PROM GBS unknown FHR reassuring No active labor Prev c/s desires TOLAC  Admit to B.S. - Dr Normand Sloop  attending, CNM care Routine CNM orders Begin PCN for GBS prophylaxis Send GBS swab now Will Obtain rest of prenatal records when office opens Observe for now if cervical change, consider pitocin aug after 8 hours (0900)    Morgan Glenn 07/04/2011, 2:42 AM

## 2011-07-04 NOTE — Progress Notes (Signed)
Pt may go to room 173. 

## 2011-07-04 NOTE — Progress Notes (Signed)
Called to assess TOLAC patient who had been pushing for about  20 mins .  Vaginal bleeding was somewhat more than expected for early second stage and FHR showed recurrent decelerations., loss of variability.  The clinical diagnosis of abruption was made. Vtx was at plus three station and the patient had good pushing effort. The recommendation for shortening the second stage of labor was conveyed to the patient.  The options for delivery were presented as c-section or vacuum assisted vaginal delivery.  Risks and benefits of each were reviewed.  The patient opted for vacuum assistance.

## 2011-07-04 NOTE — Progress Notes (Signed)
S. Lillard, CNM at bedside.  Assessment done and poc discussed with pt.  

## 2011-07-04 NOTE — Op Note (Signed)
Operative Delivery Note At 4:00 PM a viable female was delivered via Vaginal, Vacuum Investment banker, operational).  Presentation: vertex; Position: Right,; Station: +3.  Verbal consent: obtained from patient.  Risks and benefits discussed in detail.  Risks include, but are not limited to the risks of anesthesia, bleeding, infection, damage to maternal tissues, fetal cephalhematoma.  There is also the risk of inability to effect vaginal delivery of the head, or shoulder dystocia that cannot be resolved by established maneuvers, leading to the need for emergency cesarean section.  The option of c-section was offered as well in that expidited delivery was recommended secondary to clinical and FHR signs of abruption.  The patient opted to proceed with vacuum assistance  APGAR: 6, 8; weight .   Placenta status: , Spontaneous.   Cord: 3 vessels with the following complications: .  Cord pH: 7.00  Anesthesia:   Instruments: Kiwi # of contractions from start of vacuum application to delivery: 2 Episiotomy: none Lacerations: 2nd degree Suture Repair: 3-0 Monocryl Est. Blood Loss (mL): <500cc  Mom to postpartum.  Baby to nursery-stable.  HAYGOOD,VANESSA P 07/04/2011, 4:17 PM

## 2011-07-04 NOTE — Progress Notes (Signed)
Pt presents to mau via ems for ROM.  States water broke at 0100.

## 2011-07-04 NOTE — Progress Notes (Signed)
Called to by L&D at 1505 to come to pt's room for nonreassuring FHT.  On arrival to room around 1508, charge RN checking pt's cx and C/C/+1 to +2.  Mod amt of VB noted and heavier and thinner than bloody show.  Pt not having in abdominal pain or tenderness.  On exam, forebag noted and ruptured.  Pt's husband no longer present, as he had left hospital to pick up her older son at school.  FHT w/ absent variability, and recurrent decels since approximately 1455.  Pt's Pitocin discontinued and turned on side and IVF going.  O2 mask on.  As entering room, variability improved, and prepared pt and room for pushing and delivery. Pt started actively pushing soon after, and NICU called in anticipation of delivery, but after arriving, left to return later b/c further pushing efforts necessary.  As pushing progressed, pt tilted from side to side, and recurring decels continued.  Ctxs were about every 2-3 minutes.  Pt's efforts good, but with FHT still not reassuring, called Dr. Pennie Rushing to come to assess.  Pushing stopped after about 20 minutes, and pt positioned on Lt side.  Variability improved when pushed stopped. Dr. Pennie Rushing at bedside at 1545.

## 2011-07-04 NOTE — Progress Notes (Signed)
Subjective: Comfortable s/p epidural around 1145.  RN just reported SVE after a prolonged decel about 1215.  FHT stable now, but decreased variability since decel.  Pitocin on 46mu/min and was at that dose prior to epidural placement.  RN reports presenting part w/ good descent and less bloody show this exam.  Objective: BP 103/68  Pulse 73  Temp(Src) 98.2 F (36.8 C) (Oral)  Resp 16  Ht 5\' 3"  (1.6 m)  Wt 68.947 kg (152 lb)  BMI 26.93 kg/m2  SpO2 100%      FHT:  FHR: 145 bpm, variability: minimal ,  accelerations:  Present,  decelerations:  Absent UC:   regular, every 2-3 minutes SVE:   Dilation: 3.5 Effacement (%): 70 Station: -1 Exam by:: [employer] RN  Labs: Lab Results  Component Value Date   WBC 8.4 07/04/2011   HGB 11.7* 07/04/2011   HCT 35.1* 07/04/2011   MCV 90.2 07/04/2011   PLT 155 07/04/2011    Assessment / Plan: 1.  36.4  2.  Induction due to PPROM  3.  Receiving GBS prophylaxis  4.  1 prolonged decel just after epidural  5.  Early labor  6.  TOLAC   Labor: Progressing normally Preeclampsia:  no signs or symptoms of toxicity Fetal Wellbeing:  Category I Pain Control:  Epidural I/D:  n/a Anticipated MOD:  NSVD 1.  IUPC prn  2.  C/w MD prn  3.  CTO FHR closely. Handsome Anglin H 07/04/2011, 12:43 PM

## 2011-07-05 LAB — CBC
Hemoglobin: 10.5 g/dL — ABNORMAL LOW (ref 12.0–15.0)
Platelets: 127 10*3/uL — ABNORMAL LOW (ref 150–400)
RBC: 3.45 MIL/uL — ABNORMAL LOW (ref 3.87–5.11)
WBC: 10.7 10*3/uL — ABNORMAL HIGH (ref 4.0–10.5)

## 2011-07-05 MED ORDER — BENZOCAINE-MENTHOL 20-0.5 % EX AERO
INHALATION_SPRAY | CUTANEOUS | Status: AC
  Administered 2011-07-05: 1 via TOPICAL
  Filled 2011-07-05: qty 56

## 2011-07-05 MED ORDER — MEASLES, MUMPS & RUBELLA VAC ~~LOC~~ INJ
0.5000 mL | INJECTION | Freq: Once | SUBCUTANEOUS | Status: AC
  Administered 2011-07-06: 0.5 mL via SUBCUTANEOUS
  Filled 2011-07-05: qty 0.5

## 2011-07-05 NOTE — Progress Notes (Signed)
UR chart review completed.  

## 2011-07-05 NOTE — Progress Notes (Signed)
Rubella equivocal; please order MMR.

## 2011-07-05 NOTE — Anesthesia Postprocedure Evaluation (Signed)
  Anesthesia Post Note  Patient: Morgan Glenn  Procedure(s) Performed: * No procedures listed *  Anesthesia type: Epidural  Patient location: Mother/Baby  Post pain: Pain level controlled  Post assessment: Post-op Vital signs reviewed  Last Vitals:  Filed Vitals:   07/05/11 1400  BP: 91/62  Pulse: 103  Temp: 36.9 C  Resp: 19    Post vital signs: Reviewed  Level of consciousness:alert  Complications: No apparent anesthesia complications

## 2011-07-06 LAB — CULTURE, BETA STREP (GROUP B ONLY)

## 2011-07-06 MED ORDER — IBUPROFEN 600 MG PO TABS: 600.0000 mg | ORAL_TABLET | Freq: Four times a day (QID) | ORAL | Status: AC | PRN

## 2011-07-06 NOTE — Progress Notes (Signed)
Post Partum Day 1 Subjective: no complaints.  Up ad lib, no syncope or dizziness.  Breastfeeding.  Objective: Blood pressure 99/62, pulse 78, temperature 98 F (36.7 C), temperature source Oral, resp. rate 18, height 5\' 3"  (1.6 m), weight 68.947 kg (152 lb), SpO2 99.00%, unknown if currently breastfeeding.  Physical Exam:  General: alert Lochia: appropriate Uterine Fundus: firm Incision: healing well DVT Evaluation: No evidence of DVT seen on physical exam. Negative Homan's sign.   Basename 07/05/11 0555 07/04/11 0225  HGB 10.5* 11.7*  HCT 31.4* 35.1*    Assessment/Plan: Stable pp day 1 Plan for discharge tomorrow   LOS: 2 days   Morgan Glenn 07/06/2011, 5:30 AM

## 2011-07-06 NOTE — Discharge Summary (Signed)
Obstetric Discharge Summary Reason for Admission: rupture of membranes, previous cesarean, desired VBAC Prenatal Procedures: NST and ultrasound Intrapartum Procedures: vacuum, due to non-reassuring FHR and abruption Postpartum Procedures: none Complications-Operative and Postpartum: 2nd degree perineal laceration and abruption Hemoglobin  Date Value Range Status  07/05/2011 10.5* 12.0-15.0 (g/dL) Final     HCT  Date Value Range Status  07/05/2011 31.4* 36.0-46.0 (%) Final   Hospital Course: Admitted after SROM at 1am on 07/04/11. Unknown GBS at the time of admission, but negative result noted during hospitalization.  Pitocin was begun due to no labor after several hours.   Epidural was placed, and patient progressed well.  FHR decels and vaginal bleeding were noted late in second stage--fetus was at +2 station, so delivery was performed via vacumn assistance by Dr. Pennie Rushing without complication. Patient and baby tolerated the procedure without difficulty, with  2nd degree laceration noted. Infant was taken to NICU after delivery for tachypnea, and was placed an supplemental O2 and antibiotics, with a 7 day stay anticipated.  Mother had an uncomplicated postpartum course, with pumping for breastmilk going slowly. Mom's physical exam was WNL, and she was discharged home in stable condition. Patient declined contraception at the time of discharge.  She received adequate benefit from po pain medications, and was using Motrin only.  Infant was stable in NICU at the time of patient's discharge from the hospital.     Discharge Diagnoses: Term Pregnancy-delivered and abruption and vacumn assisted VBAC  Discharge Information: Date: 07/06/2011 Activity: Per [facility] handout Diet: routine Medications: Ibuprofen Condition: stable Instructions: refer to practice specific booklet Discharge to: home Contraception:  Declined at time of discharge Follow-up Information    Follow up with [facility]  in 6 weeks.         Newborn Data: Live born female  Birth Weight: 5 lb 9.2 oz (2530 g) APGAR: 6, 8  Home with mother.  Nigel Bridgeman 07/06/2011, 7:58 AM

## 2011-08-22 ENCOUNTER — Ambulatory Visit (INDEPENDENT_AMBULATORY_CARE_PROVIDER_SITE_OTHER): Payer: Medicaid Other | Admitting: Registered Nurse

## 2011-08-22 DIAGNOSIS — Z30431 Encounter for routine checking of intrauterine contraceptive device: Secondary | ICD-10-CM

## 2013-02-03 ENCOUNTER — Ambulatory Visit: Payer: Self-pay | Admitting: Family Medicine

## 2013-02-03 VITALS — BP 100/62 | HR 69 | Temp 98.2°F | Resp 18 | Ht 64.0 in | Wt 139.0 lb

## 2013-02-03 DIAGNOSIS — L738 Other specified follicular disorders: Secondary | ICD-10-CM

## 2013-02-03 DIAGNOSIS — N644 Mastodynia: Secondary | ICD-10-CM

## 2013-02-03 DIAGNOSIS — L739 Follicular disorder, unspecified: Secondary | ICD-10-CM

## 2013-02-03 MED ORDER — CEPHALEXIN 500 MG PO CAPS: 500.0000 mg | ORAL_CAPSULE | Freq: Three times a day (TID) | ORAL | Status: DC

## 2013-02-03 NOTE — Patient Instructions (Addendum)
1.  Apply warm compresses to R breast twice daily. 2. Take all of antibiotics. 3.  Return for increasing pain, redness, swelling. 4.  Area should be completely gone in two weeks; return if area persists.

## 2013-02-03 NOTE — Progress Notes (Signed)
   [address]   [place], [place]  [address]   [phone]  Subjective:    Patient ID: Morgan Glenn, female    DOB: 26-Apr-1981, 32 y.o.   MRN: [id number]  HPI This 32 y.o. female presents for evaluation of R breast bump.  No pain with onset three days ago but now a little tender for past 24 hours.  +redness and now +tender to touch.  No fever/chills/sweats.  No drainage.  No breastfeeding.  No medications.  Has not applied any topical medication to wound. Worried about breast cancer.  No family history of breast cancer.  PCP: none  Review of Systems  Constitutional: Negative for fever, chills, diaphoresis and fatigue.  Skin: Positive for color change and wound. Negative for pallor and rash.   Past Medical History  Diagnosis Date  . Anemia    No Known Allergies Current Outpatient Prescriptions on File Prior to Visit  Medication Sig Dispense Refill  . Cholecalciferol (VITAMIN D PO) Take 1 tablet by mouth daily.      . IRON PO Take 1 tablet by mouth daily.       No current facility-administered medications on file prior to visit.   History   Social History  . Marital Status: Married    Spouse Name: N/A    Number of Children: N/A  . Years of Education: N/A   Occupational History  . Not on file.   Social History Main Topics  . Smoking status: Never Smoker   . Smokeless tobacco: Not on file  . Alcohol Use: No  . Drug Use: No  . Sexual Activity: Yes   Other Topics Concern  . Not on file   Social History Narrative  . No narrative on file       Objective:   Physical Exam  Nursing note and vitals reviewed. Constitutional: She appears well-developed and well-nourished. No distress.  HENT:  Head: Normocephalic and atraumatic.  Eyes: Conjunctivae are normal. Pupils are equal, round, and reactive to light.  Genitourinary: There is breast tenderness. No breast swelling, discharge or bleeding.  R breast: at edge of lateral areola 4mm diameter pustular lesion.  +erythema; +TTP; +fluctuants.  Skin: She is not diaphoretic.  R breast: at edge of lateral areola with 4mm pustular lesion; +TTP.  Psychiatric: She has a normal mood and affect. Her behavior is normal.     PROCEDURE: VERBAL CONSENT OBTAINED; PREPPED WITH ALCOHOL SWAB; 23 GAUGE NEEDLE ADVANCED INTO LESION; WHITE DRAINAGE EXPRESSED.     Assessment & Plan:  Pain of right breast  Folliculitis   1. Pain R breast:  New.  Secondary to folliculitis/small carbuncle. 2.  R breast carbuncle/folliculitis:  New.  S/p simple I&D in office; no culture obtained.  Rx for Keflex provided.  RTC if not completely resolved in three weeks or for increasing redness, pain, swelling.  Meds ordered this encounter  Medications  . cephALEXin (KEFLEX) 500 MG capsule    Sig: Take 1 capsule (500 mg total) by mouth 3 (three) times daily.    Dispense:  21 capsule    Refill:  0

## 2013-06-11 HISTORY — DX: Maternal care for unspecified type scar from previous cesarean delivery: O34.219

## 2013-09-07 ENCOUNTER — Inpatient Hospital Stay (HOSPITAL_COMMUNITY): Payer: Medicaid Other

## 2013-09-07 ENCOUNTER — Inpatient Hospital Stay (HOSPITAL_COMMUNITY)
Admission: AD | Admit: 2013-09-07 | Discharge: 2013-09-07 | Disposition: A | Discharge status: Home / Self Care | Payer: Medicaid Other | Source: Ambulatory Visit | Attending: Obstetrics and Gynecology | Admitting: Obstetrics and Gynecology

## 2013-09-07 ENCOUNTER — Encounter (HOSPITAL_COMMUNITY): Payer: Self-pay | Admitting: *Deleted

## 2013-09-07 DIAGNOSIS — O99810 Abnormal glucose complicating pregnancy: Secondary | ICD-10-CM

## 2013-09-07 DIAGNOSIS — O34219 Maternal care for unspecified type scar from previous cesarean delivery: Secondary | ICD-10-CM | POA: Diagnosis present

## 2013-09-07 DIAGNOSIS — Z98891 History of uterine scar from previous surgery: Secondary | ICD-10-CM

## 2013-09-07 DIAGNOSIS — O4690 Antepartum hemorrhage, unspecified, unspecified trimester: Secondary | ICD-10-CM

## 2013-09-07 DIAGNOSIS — O09529 Supervision of elderly multigravida, unspecified trimester: Secondary | ICD-10-CM | POA: Diagnosis present

## 2013-09-07 DIAGNOSIS — O26859 Spotting complicating pregnancy, unspecified trimester: Secondary | ICD-10-CM | POA: Insufficient documentation

## 2013-09-07 HISTORY — DX: Abnormal glucose complicating pregnancy: O99.810

## 2013-09-07 HISTORY — DX: Antepartum hemorrhage, unspecified, unspecified trimester: O46.90

## 2013-09-07 HISTORY — DX: History of uterine scar from previous surgery: Z98.891

## 2013-09-07 HISTORY — DX: Supervision of elderly multigravida, unspecified trimester: O09.529

## 2013-09-07 LAB — URINALYSIS, ROUTINE W REFLEX MICROSCOPIC
BILIRUBIN URINE: NEGATIVE
Glucose, UA: NEGATIVE mg/dL
KETONES UR: NEGATIVE mg/dL
Nitrite: NEGATIVE
PROTEIN: NEGATIVE mg/dL
Specific Gravity, Urine: 1.015 (ref 1.005–1.030)
UROBILINOGEN UA: 0.2 mg/dL (ref 0.0–1.0)
pH: 6.5 (ref 5.0–8.0)

## 2013-09-07 LAB — CBC
HEMATOCRIT: 35.1 % — AB (ref 36.0–46.0)
Hemoglobin: 11.9 g/dL — ABNORMAL LOW (ref 12.0–15.0)
MCH: 29.3 pg (ref 26.0–34.0)
MCHC: 33.9 g/dL (ref 30.0–36.0)
MCV: 86.5 fL (ref 78.0–100.0)
Platelets: 218 10*3/uL (ref 150–400)
RBC: 4.06 MIL/uL (ref 3.87–5.11)
RDW: 13 % (ref 11.5–15.5)
WBC: 10.1 10*3/uL (ref 4.0–10.5)

## 2013-09-07 LAB — URINE MICROSCOPIC-ADD ON

## 2013-09-07 LAB — HCG, QUANTITATIVE, PREGNANCY: hCG, Beta Chain, Quant, S: 16318 m[IU]/mL — ABNORMAL HIGH (ref <5)

## 2013-09-07 LAB — POCT PREGNANCY, URINE: Preg Test, Ur: POSITIVE — AB

## 2013-09-07 NOTE — Discharge Instructions (Signed)
Vaginal Bleeding During Pregnancy, First Trimester A small amount of bleeding (spotting) from the vagina is relatively common in early pregnancy. It usually stops on its own. Various things may cause bleeding or spotting in early pregnancy. Some bleeding may be related to the pregnancy, and some may not. In most cases, the bleeding is normal and is not a problem. However, bleeding can also be a sign of something serious and indicate risk of miscarriage.  Be sure to tell your health care provider about any vaginal bleeding right away. Some possible causes of vaginal bleeding during the first trimester include:  Infection or inflammation of the cervix.  Growths (polyps) on the cervix.  Miscarriage or threatened miscarriage.  Pregnancy tissue has developed outside of the uterus and in a fallopian tube (tubal pregnancy).  Tiny cysts have developed in the uterus instead of pregnancy tissue (molar pregnancy). HOME CARE INSTRUCTIONS  Watch your condition for any changes. The following actions may help to lessen any discomfort you are feeling:  Follow your health care provider's instructions for limiting your activity. If your health care provider orders bed rest, you may need to stay in bed and only get up to use the bathroom. However, your health care provider may allow you to continue light activity.  If needed, make plans for someone to help with your regular activities and responsibilities while you are on bed rest.  Keep track of the number of pads you use each day, how often you change pads, and how soaked (saturated) they are. Write this down.  Do not use tampons. Do not douche.  Do not have sexual intercourse or orgasms until approved by your health care provider.  If you pass any tissue from your vagina, save the tissue so you can show it to your health care provider.  Only take over-the-counter or prescription medicines as directed by your health care provider.  Do not take aspirin  because it can make you bleed.  Keep all follow-up appointments as directed by your health care provider. SEEK MEDICAL CARE IF:  You have any vaginal bleeding during any part of your pregnancy.  You have cramps or labor pains. SEEK IMMEDIATE MEDICAL CARE IF:   You have severe cramps in your back or belly (abdomen).  You have a fever, not controlled by medicine.  You pass large clots or tissue from your vagina.  Your bleeding increases.  You feel lightheaded or weak, or you have fainting episodes.  You have chills.  You are leaking fluid or have a gush of fluid from your vagina.  You pass out while having a bowel movement. MAKE SURE YOU:  Understand these instructions.  Will watch your condition.  Will get help right away if you are not doing well or get worse. Document Released: 03/07/2005 Document Revised: 03/18/2013 Document Reviewed: 02/02/2013 [facility] Patient Information 2014 [place], [place].  .  You have severe cramps in your stomach, back, or belly (abdomen).  You are leaking fluid or have a gush of fluid from your vagina.  You become lightheaded or weak.  You have chills.  You have clumps of tissue or blood clots coming from your vagina.  You have a fever. MAKE SURE YOU:   Understand these instructions.  Will watch your condition.  Will get help right away if you are not doing well or get worse. Document Released: 03/06/2008 Document Revised: 05/14/2012 Document Reviewed: 03/17/2012 [facility] Patient Information 2014 [place], [place].

## 2013-09-07 NOTE — MAU Note (Signed)
Pt started bleeding last Tuesday, stopped yesterday, started again today @ 1200.  Lower back pain, [redacted] weeks pregnant.

## 2013-09-07 NOTE — MAU Provider Note (Signed)
History   33 yo G3P1102 at [redacted] weeks gestation presented c/o spotting over last several days, denies pain.  Had some brief spotting in previous pregnancy, but nothing that persisted like this.  Cultures negative on  09/03/13 in office at [facility].  Known blood type is A+.  Patient Active Problem List   Diagnosis Date Noted  . Elderly multigravida with antepartum condition or complication 09/07/2013  . Vaginal bleeding in pregnancy 09/07/2013  . Previous cesarean section--FTD 09/07/2013  . 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 Hx successful VBAC (vaginal birth after cesarean), currently pregnant 09/07/2013  No clear dx of GDM with 1st pregnancy Delivery with last pregnancy at 36 4/7 weeks.   Chief Complaint  Patient presents with  . Vaginal Bleeding  . Back Pain   HPI:  See above  OB History   Grav Para Term Preterm Abortions TAB SAB Ect Mult Living   3 2 1 1  0 0 0 0 0 2      Past Medical History  Diagnosis Date  . Anemia     Past Surgical History  Procedure Laterality Date  . Cesarean section      Family History  Problem Relation Age of Onset  . Diabetes Father   . Diabetes Sister   . Hyperlipidemia Brother     History  Substance Use Topics  . Smoking status: Never Smoker   . Smokeless tobacco: Not on file  . Alcohol Use: No    Allergies: No Known Allergies  Prescriptions prior to admission  Medication Sig Dispense Refill  . Cholecalciferol (VITAMIN D PO) Take 1 tablet by mouth daily.      . IRON PO Take 1 tablet by mouth daily.      . Prenatal Vit-Fe Fumarate-FA (PRENATAL MULTIVITAMIN) TABS tablet Take 1 tablet by mouth daily at 12 noon.        ROS: Spotting Physical Exam   Blood pressure 100/63, pulse 76, temperature 98.9 F (37.2 C), temperature source Oral, resp. rate 16, height 5\' 3"  (1.6 m), weight 145 lb 6.4 oz (65.953 kg), last menstrual period 07/13/2013.  Physical Exam Chest clear Heart RRR without murmur Abd gravid,  NT Pelvic--small amount spotting in vagina, cervix slightly friable, closed, NT, uterus small, NT Ext WNL  ED Course  First trimester bleeding  Plan; CBC, QHCG US for viability  Tawonna Esquer CNM, [place] 09/07/2013 7:46 PM  Addendum: Returned from US:  IMPRESSION:  6 w 6 days by MSD. Single intrauterine gestational sac with a thin internal septation  but no yolk sac or fetal pole.  Findings are suspicious but not yet definitive for failed pregnancy.  Recommend follow-up US in 10-14 days for definitive diagnosis. This  recommendation follows SRU consensus guidelines: Diagnostic Criteria  for Nonviable Pregnancy Early in the First Trimester. Malva Limes Engl J Med  2013; 161:0960-45; 369:1443-51.  Results for orders placed during the hospital encounter of 09/07/13 (from the past 24 hour(s))  URINALYSIS, ROUTINE W REFLEX MICROSCOPIC     Status: Abnormal   Collection Time    09/07/13  6:35 PM      Result Value Ref Range   Color, Urine YELLOW  YELLOW   APPearance CLEAR  CLEAR   Specific Gravity, Urine 1.015  1.005 - 1.030   pH 6.5  5.0 - 8.0   Glucose, UA NEGATIVE  NEGATIVE mg/dL   Hgb urine dipstick LARGE (*) NEGATIVE   Bilirubin Urine NEGATIVE  NEGATIVE   Ketones, ur NEGATIVE  NEGATIVE mg/dL  Protein, ur NEGATIVE  NEGATIVE mg/dL   Urobilinogen, UA 0.2  0.0 - 1.0 mg/dL   Nitrite NEGATIVE  NEGATIVE   Leukocytes, UA MODERATE (*) NEGATIVE  URINE MICROSCOPIC-ADD ON     Status: Abnormal   Collection Time    09/07/13  6:35 PM      Result Value Ref Range   Squamous Epithelial / LPF FEW (*) RARE   WBC, UA 7-10  <3 WBC/hpf   RBC / HPF 0-2  <3 RBC/hpf   Bacteria, UA RARE  RARE  POCT PREGNANCY, URINE     Status: Abnormal   Collection Time    09/07/13  6:40 PM      Result Value Ref Range   Preg Test, Ur POSITIVE (*) NEGATIVE  HCG, QUANTITATIVE, PREGNANCY     Status: Abnormal   Collection Time    09/07/13  7:11 PM      Result Value Ref Range   hCG, Beta Chain, [place], [place] [address] (*) <5 mIU/mL  CBC      Status: Abnormal   Collection Time    09/07/13  7:12 PM      Result Value Ref Range   WBC 10.1  4.0 - 10.5 K/uL   RBC 4.06  3.87 - 5.11 MIL/uL   Hemoglobin 11.9 (*) 12.0 - 15.0 g/dL   HCT 60.4 (*) 54.0 - 98.1 %   MCV 86.5  78.0 - 100.0 fL   MCH 29.3  26.0 - 34.0 pg   MCHC 33.9  30.0 - 36.0 g/dL   RDW 19.1  47.8 - 29.5 %   Platelets 218  150 - 400 K/uL   Reviewed findings with patient--likely SAB, but not definitive yet per [place] notes. Will repeat QHCG on Thursday--if decreasing, definite evidence of SAB.  If not, then plan [place] in 10-14 days. Bleeding precautions reviewed with patient. Support to patient for likely loss.  Nigel Bridgeman, CNM 09/07/13 10p

## 2014-01-22 ENCOUNTER — Ambulatory Visit: Payer: Medicaid Other | Attending: Internal Medicine

## 2014-04-12 ENCOUNTER — Encounter (HOSPITAL_COMMUNITY): Payer: Self-pay | Admitting: *Deleted

## 2014-05-14 ENCOUNTER — Ambulatory Visit: Payer: Medicaid Other | Attending: Internal Medicine

## 2014-07-13 ENCOUNTER — Encounter (HOSPITAL_COMMUNITY): Payer: Self-pay | Admitting: *Deleted

## 2014-10-15 ENCOUNTER — Ambulatory Visit: Payer: Medicaid Other | Attending: Internal Medicine

## 2014-11-10 IMAGING — US US OB COMP LESS 14 WK
1 series · 13 of 28 positions shown · non-contrast
Comparison: Prior obstetrical ultrasound 12/12/2010

CLINICAL DATA: Vaginal bleeding

EXAM:
OBSTETRIC <14 WK US AND TRANSVAGINAL OB US
TECHNIQUE: Both transabdominal and transvaginal ultrasound examinations were
performed for complete evaluation of the gestation as well as the
maternal uterus, adnexal regions, and pelvic cul-de-sac.
Transvaginal technique was performed to assess early pregnancy.

[Series 1: us ob comp less 14 wks · 67 acquisitions, 13 frames shown]
[im 3/67]
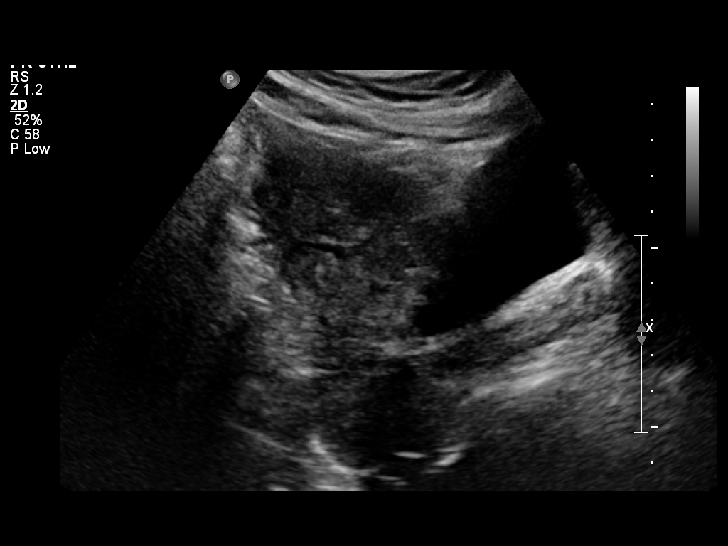
[im 8/67]
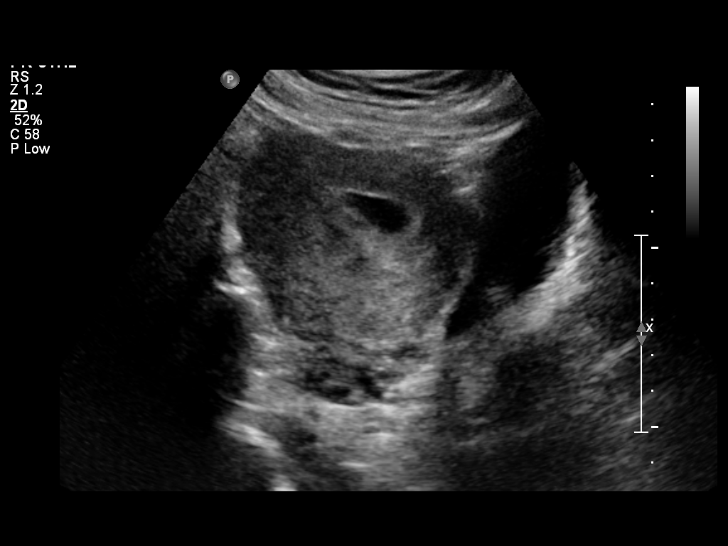
[im 13/67]
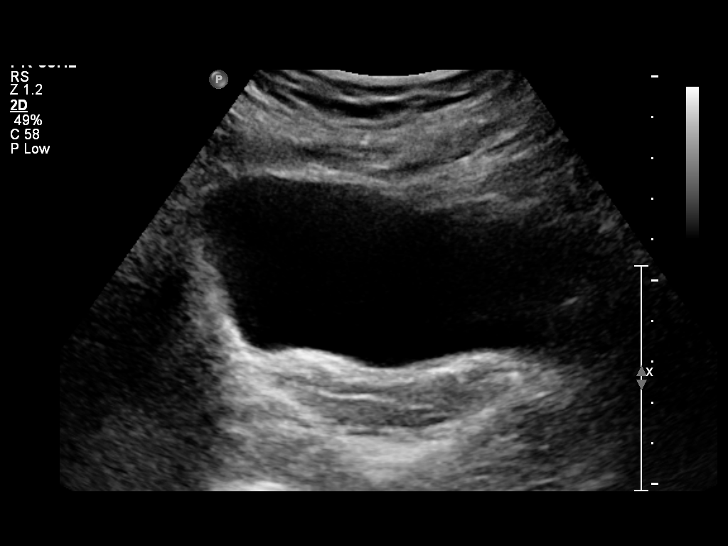
[im 18/67]
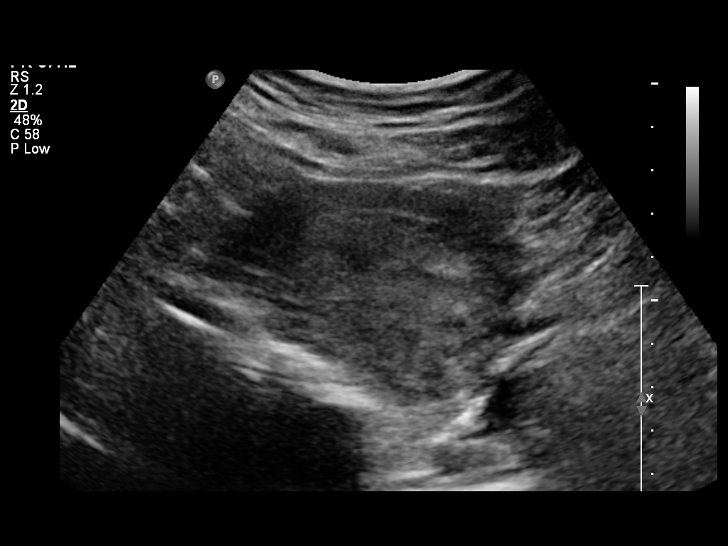
[im 23/67]
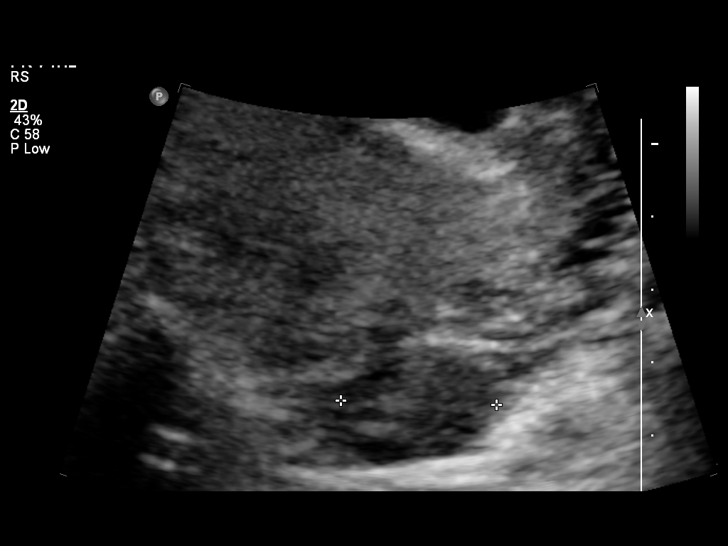
[im 27/67]
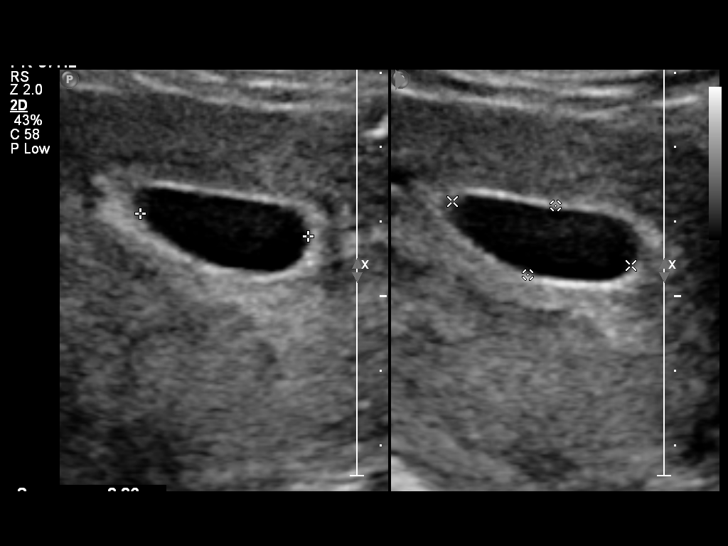
[im 35/67]
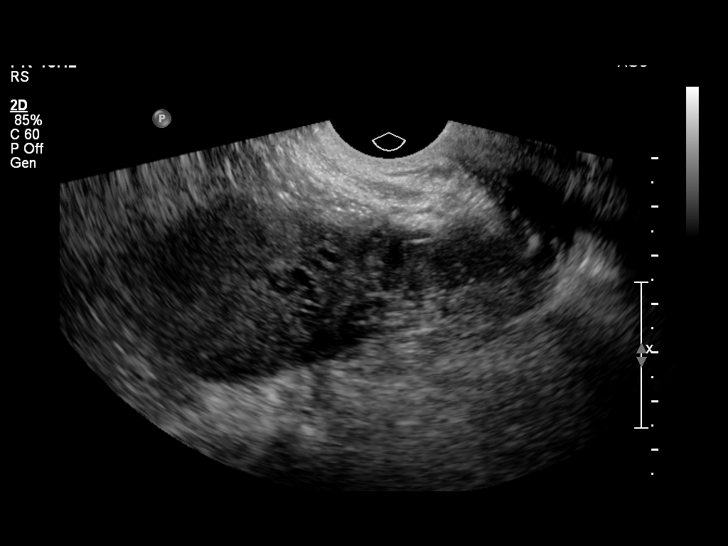
[im 40/67]
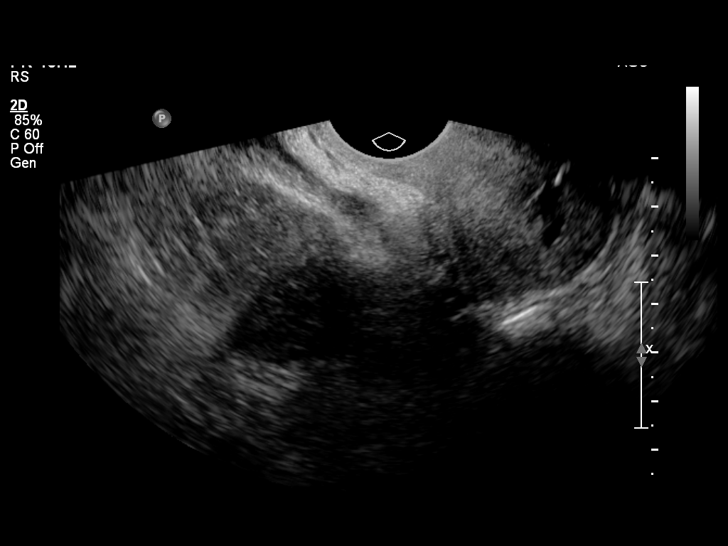
[im 45/67]
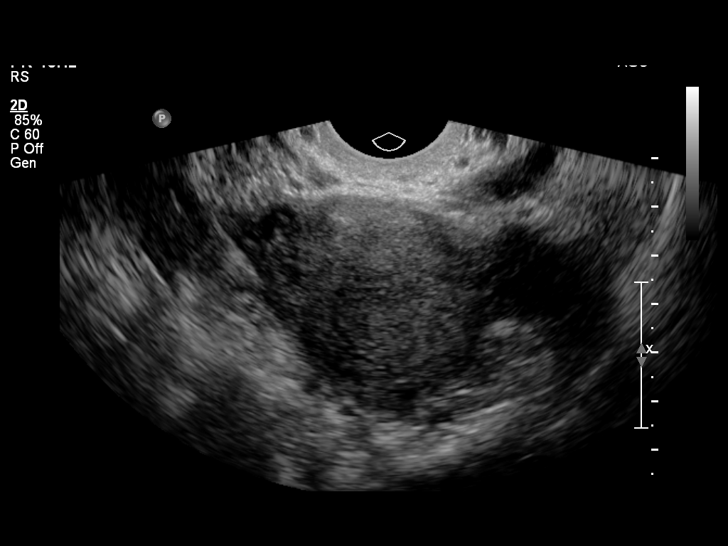
[im 49/67]
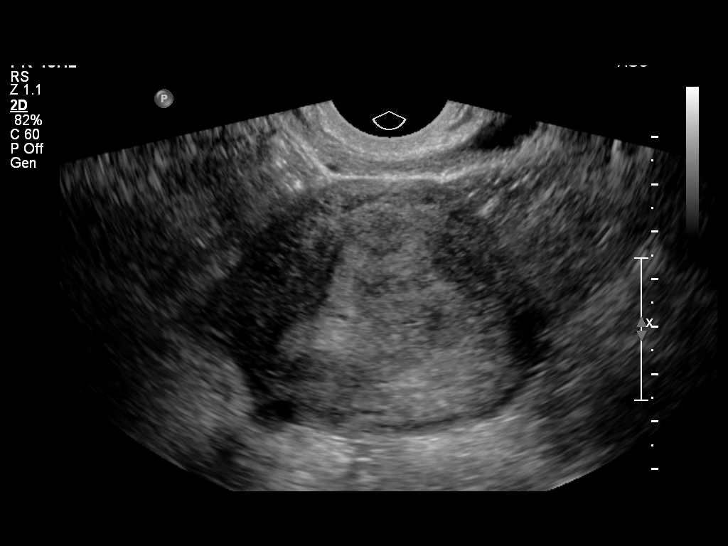
[im 54/67]
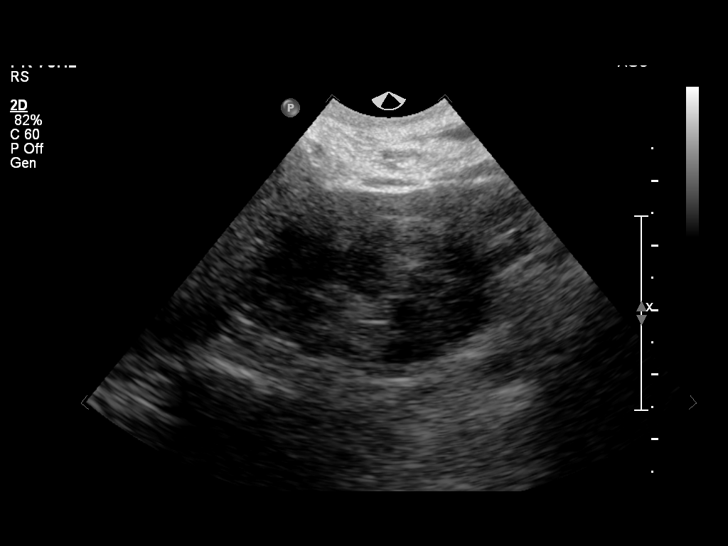
[im 59/67]
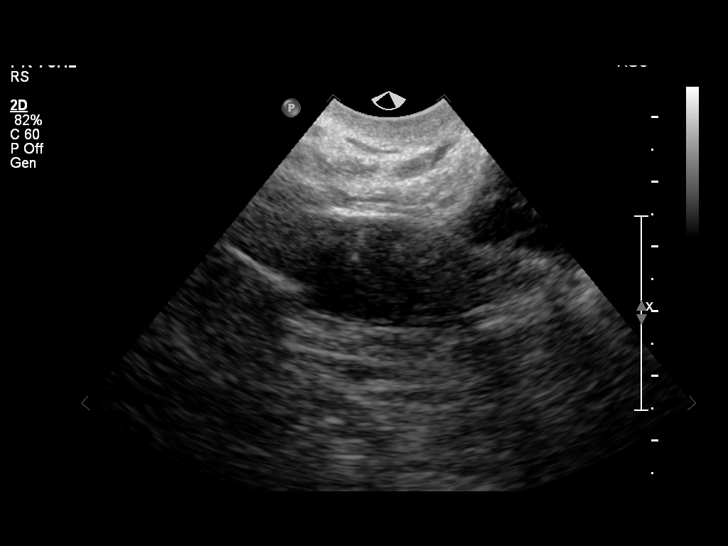
[im 64/67]
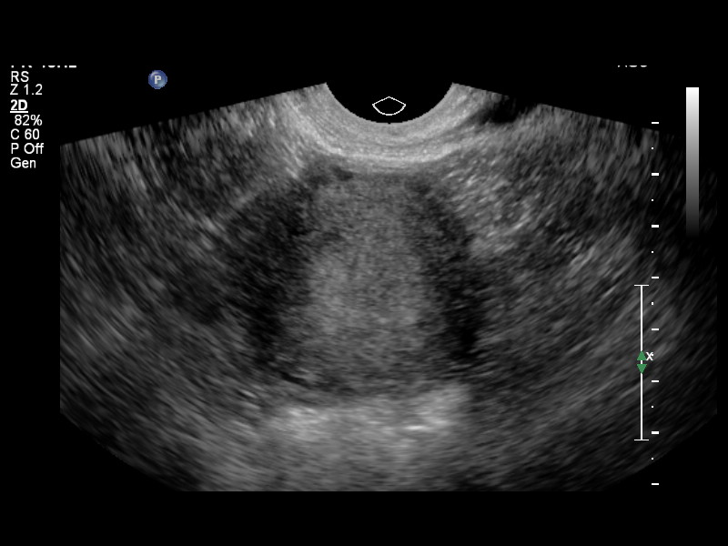

[13 of 28 positions shown; findings below may reference images not displayed]

FINDINGS: Intrauterine gestational sac: An oval shaped gestational sac is
identified within the endometrial canal of the uterine fundus. There
is a single thin internal septation within the sac. However, no yolk
sac or fetus is identified.

Yolk sac:  None

Embryo:  None

Cardiac Activity: None

Heart Rate:  0 bpm

MSD:  18.7  mm   6 w   6  d

US EDC: 04/27/2014

Maternal uterus/adnexae: No evidence of subchorionic hemorrhage. No
free fluid in the anatomic pelvis. The ovaries are unremarkable
bilaterally.
IMPRESSION: Single intrauterine gestational sac with a thin internal septation
but no yolk sac or fetal pole.

Findings are suspicious but not yet definitive for failed pregnancy.
Recommend follow-up US in 10-14 days for definitive diagnosis. This
recommendation follows SRU consensus guidelines: Diagnostic Criteria
for Nonviable Pregnancy Early in the First Trimester. N Engl J Med

## 2015-03-04 ENCOUNTER — Other Ambulatory Visit (HOSPITAL_COMMUNITY)
Admission: RE | Admit: 2015-03-04 | Discharge: 2015-03-04 | Disposition: A | Discharge status: Home / Self Care | Payer: No Typology Code available for payment source | Source: Ambulatory Visit | Attending: Emergency Medicine | Admitting: Emergency Medicine

## 2015-03-04 ENCOUNTER — Encounter (HOSPITAL_COMMUNITY): Payer: Self-pay | Admitting: Emergency Medicine

## 2015-03-04 ENCOUNTER — Emergency Department (INDEPENDENT_AMBULATORY_CARE_PROVIDER_SITE_OTHER)
Admission: EM | Admit: 2015-03-04 | Discharge: 2015-03-04 | Disposition: A | Discharge status: Home / Self Care | Payer: No Typology Code available for payment source | Source: Home / Self Care | Attending: Emergency Medicine | Admitting: Emergency Medicine

## 2015-03-04 DIAGNOSIS — N760 Acute vaginitis: Secondary | ICD-10-CM | POA: Insufficient documentation

## 2015-03-04 DIAGNOSIS — N898 Other specified noninflammatory disorders of vagina: Secondary | ICD-10-CM

## 2015-03-04 DIAGNOSIS — N730 Acute parametritis and pelvic cellulitis: Secondary | ICD-10-CM

## 2015-03-04 DIAGNOSIS — N390 Urinary tract infection, site not specified: Secondary | ICD-10-CM

## 2015-03-04 DIAGNOSIS — N72 Inflammatory disease of cervix uteri: Secondary | ICD-10-CM

## 2015-03-04 DIAGNOSIS — Z113 Encounter for screening for infections with a predominantly sexual mode of transmission: Secondary | ICD-10-CM | POA: Insufficient documentation

## 2015-03-04 LAB — POCT URINALYSIS DIP (DEVICE)
Bilirubin Urine: NEGATIVE
Glucose, UA: NEGATIVE mg/dL
Ketones, ur: NEGATIVE mg/dL
NITRITE: NEGATIVE
PH: 7 (ref 5.0–8.0)
Protein, ur: NEGATIVE mg/dL
Specific Gravity, Urine: 1.01 (ref 1.005–1.030)
Urobilinogen, UA: 0.2 mg/dL (ref 0.0–1.0)

## 2015-03-04 LAB — POCT PREGNANCY, URINE: PREG TEST UR: NEGATIVE

## 2015-03-04 MED ORDER — METRONIDAZOLE 500 MG PO TABS: 500.0000 mg | ORAL_TABLET | Freq: Two times a day (BID) | ORAL | Status: DC

## 2015-03-04 MED ORDER — CEPHALEXIN 500 MG PO CAPS: 500.0000 mg | ORAL_CAPSULE | Freq: Four times a day (QID) | ORAL | Status: DC

## 2015-03-04 MED ORDER — CEFTRIAXONE SODIUM 250 MG IJ SOLR
250.0000 mg | Freq: Once | INTRAMUSCULAR | Status: AC
  Administered 2015-03-04: 250 mg via INTRAMUSCULAR

## 2015-03-04 MED ORDER — AZITHROMYCIN 250 MG PO TABS
ORAL_TABLET | ORAL | Status: AC
Start: 2015-03-04 — End: 2015-03-04
  Filled 2015-03-04: qty 4

## 2015-03-04 MED ORDER — LIDOCAINE HCL (PF) 1 % IJ SOLN
INTRAMUSCULAR | Status: AC
  Filled 2015-03-04: qty 5

## 2015-03-04 MED ORDER — AZITHROMYCIN 250 MG PO TABS
1000.0000 mg | ORAL_TABLET | Freq: Once | ORAL | Status: AC
  Administered 2015-03-04: 1000 mg via ORAL

## 2015-03-04 MED ORDER — CEFTRIAXONE SODIUM 250 MG IJ SOLR
INTRAMUSCULAR | Status: AC
  Filled 2015-03-04: qty 250

## 2015-03-04 NOTE — Discharge Instructions (Signed)
Antibiotic Medication Antibiotics are among the most frequently prescribed medicines. Antibiotics cure illness by assisting our body to injure or kill the bacteria that cause infection. While antibiotics are useful to treat a wide variety of infections they are useless against viruses. Antibiotics cannot cure colds, flu, or other viral infections.  There are many types of antibiotics available. Your caregiver will decide which antibiotic will be useful for an illness. Never take or give someone else's antibiotics or left over medicine. Your caregiver may also take into account:  Allergies.  The cost of the medicine.  Dosing schedules.  Taste.  Common side effects when choosing an antibiotic for an infection. Ask your caregiver if you have questions about why a certain medicine was chosen. HOME CARE INSTRUCTIONS Read all instructions and labels on medicine bottles carefully. Some antibiotics should be taken on an empty stomach while others should be taken with food. Taking antibiotics incorrectly may reduce how well they work. Some antibiotics need to be kept in the refrigerator. Others should be kept at room temperature. Ask your caregiver or pharmacist if you do not understand how to give the medicine. Be sure to give the amount of medicine your caregiver has prescribed. Even if you feel better and your symptoms improve, bacteria may still remain alive in the body. Taking all of the medicine will prevent:  The infection from returning and becoming harder to treat.  Complications from partially treated infections. If there is any medicine left over after you have taken the medicine as your caregiver has instructed, throw the medicine away. Be sure to tell your caregiver if you:  Are allergic to any medicines.  Are pregnant or intend to become pregnant while using this medicine.  Are breastfeeding.  Are taking any other prescription, non-prescription medicine, or herbal  remedies.  Have any other medical conditions or problems you have not already discussed. If you are taking birth control pills, they may not work while you are on antibiotics. To avoid unwanted pregnancy:  Continue taking your birth control pills as usual.  Use a second form of birth control (such as condoms) while you are taking antibiotic medicine.  When you finish taking the antibiotic medicine, continue using the second form of birth control until you are finished with your current 1 month cycle of birth control pills. Try not to miss any doses of medicine. If you miss a dose, take it as soon as possible. However, if it is almost time for the next dose and the dosing schedule is:  2 doses a day, take the missed dose and the next dose 5 to 6 hours apart.  3 or more doses a day, take the missed dose and the next dose 2 to 4 hours apart, then go back to the normal schedule.  If you are unable to make up a missed dose, take the next scheduled dose on time and complete the missed dose at the end of the prescribed time for your medicine. SIDE EFFECTS TO TAKING ANTIBIOTICS Common side effects to antibiotic use include:  Soft stools or diarrhea.  Mild stomach upset.  Sun sensitivity. SEEK MEDICAL CARE IF:   If you get worse or do not improve within a few days of starting the medicine.  Vomiting develops.  Diaper rash or rash on the genitals appears.  Vaginal itching occurs.  White patches appear on the tongue or in the mouth.  Severe watery diarrhea and abdominal cramps occur.  Signs of an allergy develop (hives, unknown  itchy rash appears). STOP TAKING THE ANTIBIOTIC. SEEK IMMEDIATE MEDICAL CARE IF:   Urine turns dark or blood colored.  Skin turns yellow.  Easy bruising or bleeding occurs.  Joint pain or muscle aches occur.  Fever returns.  Severe headache occurs.  Signs of an allergy develop (trouble breathing, wheezing, swelling of the lips, face or tongue,  fainting, or blisters on the skin or in the mouth). STOP TAKING THE ANTIBIOTIC. Document Released: 02/08/2004 Document Revised: 08/20/2011 Document Reviewed: 02/17/2009 [facility] Patient Information 2015 [place], [place]. This information is not intended to replace advice given to you by your health care provider. Make sure you discuss any questions you have with your health care provider.  Cervicitis Cervicitis is a soreness and swelling (inflammation) of the cervix. Your cervix is located at the bottom of your uterus. It opens up to the vagina. CAUSES   Sexually transmitted infections (STIs).   Allergic reaction.   Medicines or birth control devices that are put in the vagina.   Injury to the cervix.   Bacterial infections.  RISK FACTORS You are at greater risk if you:  Have unprotected sexual intercourse.  Have sexual intercourse with many partners.  Began sexual intercourse at an early age.  Have a history of STIs. SYMPTOMS  There may be no symptoms. If symptoms occur, they may include:   Gray, white, yellow, or bad-smelling vaginal discharge.   Pain or itching of the area outside the vagina.   Painful sexual intercourse.   Lower abdominal or lower back pain, especially during intercourse.   Frequent urination.   Abnormal vaginal bleeding between periods, after sexual intercourse, or after menopause.   Pressure or a heavy feeling in the pelvis.  DIAGNOSIS  Diagnosis is made after a pelvic exam. Other tests may include:   Examination of any discharge under a microscope (wet prep).   A Pap test.  TREATMENT  Treatment will depend on the cause of cervicitis. If it is caused by an STI, both you and your partner will need to be treated. Antibiotic medicines will be given.  HOME CARE INSTRUCTIONS   Do not have sexual intercourse until your health care provider says it is okay.   Do not have sexual intercourse until your partner has been treated, if  your cervicitis is caused by an STI.   Take your antibiotics as directed. Finish them even if you start to feel better.  SEEK MEDICAL CARE IF:  Your symptoms come back.   You have a fever.  MAKE SURE YOU:   Understand these instructions.  Will watch your condition.  Will get help right away if you are not doing well or get worse. Document Released: 05/28/2005 Document Revised: 06/02/2013 Document Reviewed: 11/19/2012 [facility] Patient Information 2015 [place], [place]. This information is not intended to replace advice given to you by your health care provider. Make sure you discuss any questions you have with your health care provider.  Pelvic Inflammatory Disease Pelvic inflammatory disease (PID) refers to an infection in some or all of the female organs. The infection can be in the uterus, ovaries, fallopian tubes, or the surrounding tissues in the pelvis. PID can cause abdominal or pelvic pain that comes on suddenly (acute pelvic pain). PID is a serious infection because it can lead to lasting (chronic) pelvic pain or the inability to have children (infertile).  CAUSES  The infection is often caused by the normal bacteria found in the vaginal tissues. PID may also be caused by an infection  that is spread during sexual contact. PID can also occur following:   The birth of a baby.   A miscarriage.   An abortion.   Major pelvic surgery.   The use of an intrauterine device (IUD).   A sexual assault.  RISK FACTORS Certain factors can put a person at higher risk for PID, such as:  Being younger than 25 years.  Being sexually active at [place] age.  Usingnonbarrier contraception.  Havingmultiple sexual partners.  Having sex with someone who has symptoms of a genital infection.  Using oral contraception. Other times, certain behaviors can increase the possibility of getting PID, such as:  Having sex during your period.  Using a vaginal douche.  Having an  intrauterine device (IUD) in place. SYMPTOMS   Abdominal or pelvic pain.   Fever.   Chills.   Abnormal vaginal discharge.  Abnormal uterine bleeding.   Unusual pain shortly after finishing your period. DIAGNOSIS  Your caregiver will choose some of the following methods to make a diagnosis, such as:   Performinga physical exam and history. A pelvic exam typically reveals a very tender uterus and surrounding pelvis.   Ordering laboratory tests including a pregnancy test, blood tests, and urine test.  Orderingcultures of the vagina and cervix to check for a sexually transmitted infection (STI).  Performing an ultrasound.   Performing a laparoscopic procedure to look inside the pelvis.  TREATMENT   Antibiotic medicines may be prescribed and taken by mouth.   Sexual partners may be treated when the infection is caused by a sexually transmitted disease (STD).   Hospitalization may be needed to give antibiotics intravenously.  Surgery may be needed, but this is rare. It may take weeks until you are completely well. If you are diagnosed with PID, you should also be checked for human immunodeficiency virus (HIV). HOME CARE INSTRUCTIONS   If given, take your antibiotics as directed. Finish the medicine even if you start to feel better.   Only take over-the-counter or prescription medicines for pain, discomfort, or fever as directed by your caregiver.   Do not have sexual intercourse until treatment is completed or as directed by your caregiver. If PID is confirmed, your recent sexual partner(s) will need treatment.   Keep your follow-up appointments. SEEK MEDICAL CARE IF:   You have increased or abnormal vaginal discharge.   You need prescription medicine for your pain.   You vomit.   You cannot take your medicines.   Your partner has an STD.  SEEK IMMEDIATE MEDICAL CARE IF:   You have a fever.   You have increased abdominal or pelvic pain.    You have chills.   You have pain when you urinate.   You are not better after 72 hours following treatment.  MAKE SURE YOU:   Understand these instructions.  Will watch your condition.  Will get help right away if you are not doing well or get worse. Document Released: 05/28/2005 Document Revised: 09/22/2012 Document Reviewed: 05/24/2011 [facility] Patient Information 2015 [place], [place]. This information is not intended to replace advice given to you by your health care provider. Make sure you discuss any questions you have with your health care provider.  Urinary Tract Infection Urinary tract infections (UTIs) can develop anywhere along your urinary tract. Your urinary tract is your body's drainage system for removing wastes and extra water. Your urinary tract includes two kidneys, two ureters, a bladder, and a urethra. Your kidneys are a pair of bean-shaped organs. Each  kidney is about the size of your fist. They are located below your ribs, one on each side of your spine. CAUSES Infections are caused by microbes, which are microscopic organisms, including fungi, viruses, and bacteria. These organisms are so small that they can only be seen through a microscope. Bacteria are the microbes that most commonly cause UTIs. SYMPTOMS  Symptoms of UTIs may vary by age and gender of the patient and by the location of the infection. Symptoms in young women typically include a frequent and intense urge to urinate and a painful, burning feeling in the bladder or urethra during urination. Older women and men are more likely to be tired, shaky, and weak and have muscle aches and abdominal pain. A fever may mean the infection is in your kidneys. Other symptoms of a kidney infection include pain in your back or sides below the ribs, nausea, and vomiting. DIAGNOSIS To diagnose a UTI, your caregiver will ask you about your symptoms. Your caregiver also will ask to provide a urine sample. The urine  sample will be tested for bacteria and white blood cells. White blood cells are made by your body to help fight infection. TREATMENT  Typically, UTIs can be treated with medication. Because most UTIs are caused by a bacterial infection, they usually can be treated with the use of antibiotics. The choice of antibiotic and length of treatment depend on your symptoms and the type of bacteria causing your infection. HOME CARE INSTRUCTIONS  If you were prescribed antibiotics, take them exactly as your caregiver instructs you. Finish the medication even if you feel better after you have only taken some of the medication.  Drink enough water and fluids to keep your urine clear or pale yellow.  Avoid caffeine, tea, and carbonated beverages. They tend to irritate your bladder.  Empty your bladder often. Avoid holding urine for long periods of time.  Empty your bladder before and after sexual intercourse.  After a bowel movement, women should cleanse from front to back. Use each tissue only once. SEEK MEDICAL CARE IF:   You have back pain.  You develop a fever.  Your symptoms do not begin to resolve within 3 days. SEEK IMMEDIATE MEDICAL CARE IF:   You have severe back pain or lower abdominal pain.  You develop chills.  You have nausea or vomiting.  You have continued burning or discomfort with urination. MAKE SURE YOU:   Understand these instructions.  Will watch your condition.  Will get help right away if you are not doing well or get worse. Document Released: 03/07/2005 Document Revised: 11/27/2011 Document Reviewed: 07/06/2011 [facility] Patient Information 2015 [place], [place]. This information is not intended to replace advice given to you by your health care provider. Make sure you discuss any questions you have with your health care provider.

## 2015-03-04 NOTE — ED Notes (Signed)
Vaginal discharge, "like yogurt".  Pain with urination and urinating frequently,  Onset of symptoms 9/21

## 2015-03-04 NOTE — ED Provider Notes (Signed)
CSN: [id number]     Arrival date & time 03/04/15  1624 History   First MD Initiated Contact with Patient 03/04/15 1731     Chief Complaint  Patient presents with  . Urinary Tract Infection   (Consider location/radiation/quality/duration/timing/severity/associated sxs/prior Treatment) HPI Comments: 34 year old female complaining of an approximately one-week history of dysuria, urinary frequency and intermittent urinary stream.  In addition she discusses an abnormal vaginal discharge and pelvic pain for the past "several days. She is not more specific. Denies abdominal pain or fever.   Past Medical History  Diagnosis Date  . Anemia    Past Surgical History  Procedure Laterality Date  . Cesarean section     Family History  Problem Relation Age of Onset  . Diabetes Father   . Diabetes Sister   . Hyperlipidemia Brother    Social History  Substance Use Topics  . Smoking status: Never Smoker   . Smokeless tobacco: None  . Alcohol Use: No   OB History    Gravida Para Term Preterm AB TAB SAB Ectopic Multiple Living   0 0 0 0 0 2     Review of Systems  Constitutional: Negative for fever and activity change.  Cardiovascular: Negative for chest pain.  Gastrointestinal: Negative for nausea, vomiting and abdominal pain.  Genitourinary: Positive for dysuria, frequency, vaginal discharge and pelvic pain. Negative for menstrual problem.  Musculoskeletal: Negative.   All other systems reviewed and are negative.   Allergies  Review of patient's allergies indicates no known allergies.  Home Medications   Prior to Admission medications   Medication Sig Start Date End Date Taking? Authorizing Provider  Phenazopyridine HCl (AZO TABS PO) Take by mouth.   Yes Historical Provider, MD  cephALEXin (KEFLEX) 500 MG capsule Take 1 capsule (500 mg total) by mouth 4 (four) times daily. 03/04/15   Hayden Rasmussen, NP  Cholecalciferol (VITAMIN D PO) Take 1 tablet by mouth daily.    Historical  Provider, MD  IRON PO Take 1 tablet by mouth daily.    Historical Provider, MD  metroNIDAZOLE (FLAGYL) 500 MG tablet Take 1 tablet (500 mg total) by mouth 2 (two) times daily. X 7 days 03/04/15   Hayden Rasmussen, NP  Prenatal Vit-Fe Fumarate-FA (PRENATAL MULTIVITAMIN) TABS tablet Take 1 tablet by mouth daily at 12 noon.    Historical Provider, MD   Meds Ordered and Administered this Visit   Medications  cefTRIAXone (ROCEPHIN) injection 250 mg (not administered)  azithromycin (ZITHROMAX) tablet 1,000 mg (not administered)    BP 100/69 mmHg  Pulse 66  Temp(Src) 97.6 F (36.4 C) (Oral)  Resp 16  SpO2 97%  LMP 02/15/2015 No data found.   Physical Exam  Constitutional: She is oriented to person, place, and time. She appears well-developed and well-nourished. No distress.  Eyes: EOM are normal.  Neck: Normal range of motion. Neck supple.  Cardiovascular: Normal rate.   Pulmonary/Chest: Effort normal. No respiratory distress.  Genitourinary:  Normal external female genitalia Intravaginal walls are coated with a thin white discharge. The cervix is coated/coated with copious amount of thin white discharge mixed  withclear mucoid fluid.  The ectocervix has patchy erythema with 4 areas that are bleeding. In general the cervix is fragile.  Bimanual: Positive CMT and positive for bilateral adnexal tenderness.   Musculoskeletal: She exhibits no edema.  Neurological: She is alert and oriented to person, place, and time. She exhibits normal muscle tone.  Skin: Skin is warm and dry.  Psychiatric: She has a normal mood and affect.  Nursing note and vitals reviewed.   ED Course  Procedures (including critical care time)  Labs Review Labs Reviewed  POCT URINALYSIS DIP (DEVICE) - Abnormal; Notable for the following:    Hgb urine dipstick TRACE (*)    Leukocytes, UA TRACE (*)    All other components within normal limits  URINE CULTURE  POCT PREGNANCY, URINE  CERVICOVAGINAL ANCILLARY ONLY    Results for orders placed or performed during the hospital encounter of 03/04/15  POCT urinalysis dip (device)  Result Value Ref Range   Glucose, UA NEGATIVE NEGATIVE mg/dL   Bilirubin Urine NEGATIVE NEGATIVE   Ketones, ur NEGATIVE NEGATIVE mg/dL   Specific Gravity, Urine 1.010 1.005 - 1.030   Hgb urine dipstick TRACE (A) NEGATIVE   pH 7.0 5.0 - 8.0   Protein, ur NEGATIVE NEGATIVE mg/dL   Urobilinogen, UA 0.2 0.0 - 1.0 mg/dL   Nitrite NEGATIVE NEGATIVE   Leukocytes, UA TRACE (A) NEGATIVE  Pregnancy, urine POC  Result Value Ref Range   Preg Test, Ur NEGATIVE NEGATIVE     Imaging Review No results found.   Visual Acuity Review  Right Eye Distance:   Left Eye Distance:   Bilateral Distance:    Right Eye Near:   Left Eye Near:    Bilateral Near:         MDM   1. Vaginal discharge   2. Cervicitis   3. PID (acute pelvic inflammatory disease)   4. UTI (lower urinary tract infection)    Rocephin 250 mg IM Azithromycin 1 g by mouth Flagyl 500 mg twice a day for 7 days Keflex as directed for UTI Drink plenty of fluids and stay well-hydrated Urine culture and cervical cytology pending    Hayden Rasmussen, NP 03/04/15 1821

## 2015-03-05 LAB — URINE CULTURE: Special Requests: NORMAL

## 2015-03-07 LAB — CERVICOVAGINAL ANCILLARY ONLY
Chlamydia: NEGATIVE
NEISSERIA GONORRHEA: NEGATIVE

## 2015-03-08 LAB — CERVICOVAGINAL ANCILLARY ONLY: Wet Prep (BD Affirm): NEGATIVE

## 2015-03-08 NOTE — ED Notes (Signed)
Final reports of vaginal swab testing negative for yeast, gardnerella, GC, chlamydia or trichomonas. UA testing negative for infection

## 2015-11-09 ENCOUNTER — Ambulatory Visit (INDEPENDENT_AMBULATORY_CARE_PROVIDER_SITE_OTHER): Payer: Self-pay | Admitting: Family Medicine

## 2015-11-09 VITALS — BP 102/66 | HR 64 | Temp 98.4°F | Resp 16 | Ht 63.0 in | Wt 131.0 lb

## 2015-11-09 DIAGNOSIS — J209 Acute bronchitis, unspecified: Secondary | ICD-10-CM

## 2015-11-09 MED ORDER — AMOXICILLIN 500 MG PO CAPS: 500.0000 mg | ORAL_CAPSULE | Freq: Three times a day (TID) | ORAL | Status: DC

## 2015-11-09 NOTE — Progress Notes (Signed)
   HPI  Patient presents today with cough and sore throat  Pt c/o 4-5 days of cough, sore throat, chills, malaise, and chest pain with deep inspiration and chills.   Also has L ear pain described as sharp and intermittent  She has missed work one day this week   She is tolerating food and fluids normally.  She has a sick 19235 year old with similar symps She denies dyspnea She is noit a smoker  PMH: Smoking status noted ROS: Per HPI  Objective: BP 102/66 mmHg  Pulse 64  Temp(Src) 98.4 F (36.9 C) (Oral)  Resp 16  Ht 5\' 3"  (1.6 m)  Wt 131 lb (59.421 kg)  BMI 23.21 kg/m2  SpO2 100%  LMP 11/06/2015 Gen: NAD, alert, cooperative with exam HEENT: NCAT, oral mucosa moist, TMs WNL, Nares clear CV: RRR, good S1/S2, no murmur Resp: non labored, slight coarse sounds in upper R lung field Ext: No edema, warm Neuro: Alert and oriented, No gross deficits  Assessment and plan:  # acute bronchitis Treating with amox given chest pain, coarse breath sounds, and worsening course Likely viral given sick 8935 year old but given coarse, I am inclined to cover discussed OTC cough meds RTC with any worsening or fialure to improve.    Meds ordered this encounter  Medications  . amoxicillin (AMOXIL) 500 MG capsule    Sig: Take 1 capsule (500 mg total) by mouth 3 (three) times daily.    Dispense:  30 capsule    Refill:  0    Kevin FentonSamuel Amando Chaput, MD 8:49 AM

## 2015-11-09 NOTE — Patient Instructions (Addendum)
  Great to meet you!  I am treating you for a possible bacterial infection, I have prescribed amoxicillin  If you have any concerns please come back   IF you received an x-ray today, you will [facility]. Please contact [facility] at [phone] with questions or concerns regarding your invoice.   IF you received labwork today, you will receive an invoice from [facility]. Please contact [facility] at [phone] with questions or concerns regarding your invoice.   Our billing staff will not be able to assist you with questions regarding bills from these companies.  You will be contacted with the lab results as soon as they are available. The fastest way to get your results is to activate your My Chart account. Instructions are located on the last page of this paperwork. If you have not heard from us regarding the results in 2 weeks, please contact this office.     Acute Bronchitis Bronchitis is when the airways that extend from the windpipe into the lungs get red, puffy, and painful (inflamed). Bronchitis often causes thick spit (mucus) to develop. This leads to a cough. A cough is the most common symptom of bronchitis. In acute bronchitis, the condition usually begins suddenly and goes away over time (usually in 2 weeks). Smoking, allergies, and asthma can make bronchitis worse. Repeated episodes of bronchitis may cause more lung problems. HOME CARE  Rest.  Drink enough fluids to keep your pee (urine) clear or pale yellow (unless you need to limit fluids as told by your doctor).  Only take over-the-counter or prescription medicines as told by your doctor.  Avoid smoking and secondhand smoke. These can make bronchitis worse. If you are a smoker, think about using nicotine gum or skin patches. Quitting smoking will help your lungs heal faster.  Reduce the chance of getting bronchitis again by:  Washing your hands  often.  Avoiding people with cold symptoms.  Trying not to touch your hands to your mouth, nose, or eyes.  Follow up with your doctor as told. GET HELP IF: Your symptoms do not improve after 1 week of treatment. Symptoms include:  Cough.  Fever.  Coughing up thick spit.  Body aches.  Chest congestion.  Chills.  Shortness of breath.  Sore throat. GET HELP RIGHT AWAY IF:   You have an increased fever.  You have chills.  You have severe shortness of breath.  You have bloody thick spit (sputum).  You throw up (vomit) often.  You lose too much body fluid (dehydration).  You have a severe headache.  You faint. MAKE SURE YOU:   Understand these instructions.  Will watch your condition.  Will get help right away if you are not doing well or get worse.   This information is not intended to replace advice given to you by your health care provider. Make sure you discuss any questions you have with your health care provider.   Document Released: 11/14/2007 Document Revised: 01/28/2013 Document Reviewed: 11/18/2012 [employer] Interactive Patient Education [employer]2016 [employer]

## 2015-12-12 ENCOUNTER — Ambulatory Visit (INDEPENDENT_AMBULATORY_CARE_PROVIDER_SITE_OTHER): Payer: Self-pay | Admitting: Emergency Medicine

## 2015-12-12 VITALS — BP 96/62 | HR 60 | Temp 98.2°F | Resp 18 | Ht 63.0 in | Wt 128.4 lb

## 2015-12-12 DIAGNOSIS — Z0000 Encounter for general adult medical examination without abnormal findings: Secondary | ICD-10-CM

## 2015-12-12 DIAGNOSIS — Z111 Encounter for screening for respiratory tuberculosis: Secondary | ICD-10-CM

## 2015-12-12 DIAGNOSIS — Z021 Encounter for pre-employment examination: Secondary | ICD-10-CM

## 2015-12-12 NOTE — Patient Instructions (Addendum)
   IF you received an x-ray today, you will receive an invoice from [facility]. Please contact [facility] at [phone] with questions or concerns regarding your invoice.   IF you received labwork today, you will receive an invoice from [facility]. Please contact [facility] at [phone] with questions or concerns regarding your invoice.   Our billing staff will not be able to assist you with questions regarding bills from these companies.  You will be contacted with the lab results as soon as they are available. The fastest way to get your results is to activate your My Chart account. Instructions are located on the last page of this paperwork. If you have not heard from us regarding the results in 2 weeks, please contact this office.     Health Maintenance, Female Adopting a healthy lifestyle and getting preventive care can go a long way to promote health and wellness. Talk with your health care provider about what schedule of regular examinations is right for you. This is a good chance for you to check in with your provider about disease prevention and staying healthy. In between checkups, there are plenty of things you can do on your own. Experts have done a lot of research about which lifestyle changes and preventive measures are most likely to keep you healthy. Ask your health care provider for more information. WEIGHT AND DIET  Eat a healthy diet  Be sure to include plenty of vegetables, fruits, low-fat dairy products, and lean protein.  Do not eat a lot of foods high in solid fats, added sugars, or salt.  Get regular exercise. This is one of the most important things you can do for your health.  Most adults should exercise for at least 150 minutes each week. The exercise should increase your heart rate and make you sweat (moderate-intensity exercise).  Most adults should also do strengthening exercises at least twice a week. This  is in addition to the moderate-intensity exercise.  Maintain a healthy weight  Body mass index (BMI) is a measurement that can be used to identify possible weight problems. It estimates body fat based on height and weight. Your health care provider can help determine your BMI and help you achieve or maintain a healthy weight.  For females 20 years of age and older:   A BMI below 18.5 is considered underweight.  A BMI of 18.5 to 24.9 is normal.  A BMI of 25 to 29.9 is considered overweight.  A BMI of 30 and above is considered obese.  Watch levels of cholesterol and blood lipids  You should start having your blood tested for lipids and cholesterol at 37 years of age, then have this test every 5 years.  You may need to have your cholesterol levels checked more often if:  Your lipid or cholesterol levels are high.  You are older than 37 years of age.  You are at high risk for heart disease.  CANCER SCREENING   Lung Cancer  Lung cancer screening is recommended for adults 55-80 years old who are at high risk for lung cancer because of a history of smoking.  A yearly low-dose CT scan of the lungs is recommended for people who:  Currently smoke.  Have quit within the past 15 years.  Have at least a 30-pack-year history of smoking. A pack year is smoking an average of one pack of cigarettes a day for 1 year.  Yearly screening should continue until it has been   15 years since you quit.  Yearly screening should stop if you develop a health problem that would prevent you from having lung cancer treatment.  Breast Cancer  Practice breast self-awareness. This means understanding how your breasts normally appear and feel.  It also means doing regular breast self-exams. Let your health care provider know about any changes, no matter how small.  If you are in your 20s or 30s, you should have a clinical breast exam (CBE) by a health care provider every 1-3 years as part of a  regular health exam.  If you are 40 or older, have a CBE every year. Also consider having a breast X-ray (mammogram) every year.  If you have a family history of breast cancer, talk to your health care provider about genetic screening.  If you are at high risk for breast cancer, talk to your health care provider about having an MRI and a mammogram every year.  Breast cancer gene (BRCA) assessment is recommended for women who have family members with BRCA-related cancers. BRCA-related cancers include:  Breast.  Ovarian.  Tubal.  Peritoneal cancers.  Results of the assessment will determine the need for genetic counseling and BRCA1 and BRCA2 testing. Cervical Cancer Your health care provider may recommend that you be screened regularly for cancer of the pelvic organs (ovaries, uterus, and vagina). This screening involves a pelvic examination, including checking for microscopic changes to the surface of your cervix (Pap test). You may be encouraged to have this screening done every 3 years, beginning at age 21.  For women ages 30-65, health care providers may recommend pelvic exams and Pap testing every 3 years, or they may recommend the Pap and pelvic exam, combined with testing for human papilloma virus (HPV), every 5 years. Some types of HPV increase your risk of cervical cancer. Testing for HPV may also be done on women of any age with unclear Pap test results.  Other health care providers may not recommend any screening for nonpregnant women who are considered low risk for pelvic cancer and who do not have symptoms. Ask your health care provider if a screening pelvic exam is right for you.  If you have had past treatment for cervical cancer or a condition that could lead to cancer, you need Pap tests and screening for cancer for at least 20 years after your treatment. If Pap tests have been discontinued, your risk factors (such as having a new sexual partner) need to be reassessed to  determine if screening should resume. Some women have medical problems that increase the chance of getting cervical cancer. In these cases, your health care provider may recommend more frequent screening and Pap tests. Colorectal Cancer  This type of cancer can be detected and often prevented.  Routine colorectal cancer screening usually begins at 37 years of age and continues through 37 years of age.  Your health care provider may recommend screening at an earlier age if you have risk factors for colon cancer.  Your health care provider may also recommend using home test kits to check for hidden blood in the stool.  A small camera at the end of a tube can be used to examine your colon directly (sigmoidoscopy or colonoscopy). This is done to check for the earliest forms of colorectal cancer.  Routine screening usually begins at age 50.  Direct examination of the colon should be repeated every 5-10 years through 37 years of age. However, you may need to be screened more often if   early forms of precancerous polyps or small growths are found. Skin Cancer  Check your skin from head to toe regularly.  Tell your health care provider about any new moles or changes in moles, especially if there is a change in a mole's shape or color.  Also tell your health care provider if you have a mole that is larger than the size of a pencil eraser.  Always use sunscreen. Apply sunscreen liberally and repeatedly throughout the day.  Protect yourself by wearing long sleeves, pants, a wide-brimmed hat, and sunglasses whenever you are outside. HEART DISEASE, DIABETES, AND HIGH BLOOD PRESSURE   High blood pressure causes heart disease and increases the risk of stroke. High blood pressure is more likely to develop in:  People who have blood pressure in the high end of the normal range (130-139/85-89 mm Hg).  People who are overweight or obese.  People who are African American.  If you are 18-39 years of  age, have your blood pressure checked every 3-5 years. If you are 40 years of age or older, have your blood pressure checked every year. You should have your blood pressure measured twice--once when you are at a hospital or clinic, and once when you are not at a hospital or clinic. Record the average of the two measurements. To check your blood pressure when you are not at a hospital or clinic, you can use:  An automated blood pressure machine at a pharmacy.  A home blood pressure monitor.  If you are between 55 years and 79 years old, ask your health care provider if you should take aspirin to prevent strokes.  Have regular diabetes screenings. This involves taking a blood sample to check your fasting blood sugar level.  If you are at a normal weight and have a low risk for diabetes, have this test once every three years after 37 years of age.  If you are overweight and have a high risk for diabetes, consider being tested at a younger age or more often. PREVENTING INFECTION  Hepatitis B  If you have a higher risk for hepatitis B, you should be screened for this virus. You are considered at high risk for hepatitis B if:  You were born in a country where hepatitis B is common. Ask your health care provider which countries are considered high risk.  Your parents were born in a high-risk country, and you have not been immunized against hepatitis B (hepatitis B vaccine).  You have HIV or AIDS.  You use needles to inject street drugs.  You live with someone who has hepatitis B.  You have had sex with someone who has hepatitis B.  You get hemodialysis treatment.  You take certain medicines for conditions, including cancer, organ transplantation, and autoimmune conditions. Hepatitis C  Blood testing is recommended for:  Everyone born from 1945 through 1965.  Anyone with known risk factors for hepatitis C. Sexually transmitted infections (STIs)  You should be screened for sexually  transmitted infections (STIs) including gonorrhea and chlamydia if:  You are sexually active and are younger than 37 years of age.  You are older than 37 years of age and your health care provider tells you that you are at risk for this type of infection.  Your sexual activity has changed since you were last screened and you are at an increased risk for chlamydia or gonorrhea. Ask your health care provider if you are at risk.  If you do not have HIV, but   are at risk, it may be recommended that you take a prescription medicine daily to prevent HIV infection. This is called pre-exposure prophylaxis (PrEP). You are considered at risk if:  You are sexually active and do not regularly use condoms or know the HIV status of your partner(s).  You take drugs by injection.  You are sexually active with a partner who has HIV. Talk with your health care provider about whether you are at high risk of being infected with HIV. If you choose to begin PrEP, you should first be tested for HIV. You should then be tested every 3 months for as long as you are taking PrEP.  PREGNANCY   If you are premenopausal and you may become pregnant, ask your health care provider about preconception counseling.  If you may become pregnant, take 400 to 800 micrograms (mcg) of folic acid every day.  If you want to prevent pregnancy, talk to your health care provider about birth control (contraception). OSTEOPOROSIS AND MENOPAUSE   Osteoporosis is a disease in which the bones lose minerals and strength with aging. This can result in serious bone fractures. Your risk for osteoporosis can be identified using a bone density scan.  If you are 68 years of age or older, or if you are at risk for osteoporosis and fractures, ask your health care provider if you should be screened.  Ask your health care provider whether you should take a calcium or vitamin D supplement to lower your risk for osteoporosis.  Menopause may have  certain physical symptoms and risks.  Hormone replacement therapy may reduce some of these symptoms and risks. Talk to your health care provider about whether hormone replacement therapy is right for you.  HOME CARE INSTRUCTIONS   Schedule regular health, dental, and eye exams.  Stay current with your immunizations.   Do not use any tobacco products including cigarettes, chewing tobacco, or electronic cigarettes.  If you are pregnant, do not drink alcohol.  If you are breastfeeding, limit how much and how often you drink alcohol.  Limit alcohol intake to no more than 1 drink per day for nonpregnant women. One drink equals 12 ounces of beer, 5 ounces of wine, or 1 ounces of hard liquor.  Do not use street drugs.  Do not share needles.  Ask your health care provider for help if you need support or information about quitting drugs.  Tell your health care provider if you often feel depressed.  Tell your health care provider if you have ever been abused or do not feel safe at home.   This information is not intended to replace advice given to you by your health care provider. Make sure you discuss any questions you have with your health care provider.   Document Released: 12/11/2010 Document Revised: 06/18/2014 Document Reviewed: 04/29/2013 Elsevier Interactive Patient Education [employer].

## 2015-12-12 NOTE — Progress Notes (Signed)
By signing my name below, I, Raven Small, attest that this documentation has been prepared under the direction and in the presence of Arlyss Queen, MD.  Electronically Signed: Thea Alken, ED Scribe. 12/12/2015. 11:52 AM.  Chief Complaint:  Chief Complaint  Patient presents with  . Annual Exam    no pap    HPI: Morgan Glenn is a 35 y.o. female who reports to [facility] today for an annual exam. 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 She plans to start in August. She denies known medical problems. She does not take medication regularly. She is unsure if she's ever had a TB test. Pt is healthy otherwise without complaints.  Pt has 2 children, 4 and 31 y.o.  Immunization History  Administered Date(s) Administered  . MMR 07/06/2011  . Tdap 07/05/2011    Pt is from [place] and has been in the [place] for 12 years.   Past Medical History  Diagnosis Date  . Anemia    Past Surgical History  Procedure Laterality Date  . Cesarean section     Social History   Social History  . Marital Status: Married    Spouse Name: N/A  . Number of Children: N/A  . Years of Education: N/A   Social History Main Topics  . Smoking status: Never Smoker   . Smokeless tobacco: None  . Alcohol Use: No  . Drug Use: No  . Sexual Activity: Yes   Other Topics Concern  . None   Social History Narrative   Family History  Problem Relation Age of Onset  . Diabetes Father   . Diabetes Sister   . Hyperlipidemia Brother    No Known Allergies Prior to Admission medications   Medication Sig Start Date End Date Taking? Authorizing Provider  Phenazopyridine HCl (AZO TABS PO) Take by mouth. Reported on 12/12/2015    Historical Provider, MD     ROS: The patient denies fevers, chills, night sweats, unintentional weight loss, chest pain, palpitations, wheezing, dyspnea on exertion, nausea, vomiting, abdominal pain, dysuria, hematuria, melena, numbness, weakness, or tingling.   All other systems  have been reviewed and were otherwise negative with the exception of those mentioned in the HPI and as above.    PHYSICAL EXAM: Filed Vitals:   12/12/15 1140  BP: 96/62  Pulse: 60  Temp: 98.2 F (36.8 C)  Resp: 18   Body mass index is 22.75 kg/(m^2).   General: Alert, no acute distress HEENT:  Normocephalic, atraumatic, oropharynx patent. Eye: Juliette Mangle [facility] Cardiovascular:  Regular rate and rhythm, no rubs murmurs or gallops.  No Carotid bruits, radial pulse intact. No pedal edema.  Respiratory: Clear to auscultation bilaterally.  No wheezes, rales, or rhonchi.  No cyanosis, no use of accessory musculature Abdominal: No organomegaly, abdomen is soft and non-tender, positive bowel sounds.  No masses. Musculoskeletal: Gait intact. No edema, tenderness Skin: No rashes. Neurologic: Facial musculature symmetric. Psychiatric: Patient acts appropriately throughout our interaction. Lymphatic: No cervical or submandibular lymphadenopathy   Visual Acuity Screening   Right eye Left eye Both eyes  Without correction: 20/20 -1 20/20-1 20/20 -1  With correction:      LABS:    EKG/XRAY:   Primary read interpreted by Dr. Everlene Farrier at [facility].   ASSESSMENT/PLAN: Form completed [employer] gold was drawn. Form completed. I personally performed the services described in this documentation, which was scribed in my presence. The recorded information has been reviewed and is accurate.   Gross sideeffects,  risk and benefits, and alternatives of medications d/w patient. Patient is aware that all medications have potential sideeffects and we are unable to predict every sideeffect or drug-drug interaction that may occur.  Arlyss Queen MD 12/12/2015 11:50 AM

## 2015-12-14 LAB — QUANTIFERON TB GOLD ASSAY (BLOOD)
Interferon Gamma Release Assay: NEGATIVE
Mitogen-Nil: >10 IU/mL
Quantiferon Nil Value: 0.04 IU/mL
Quantiferon Tb Ag Minus Nil Value: 0.04 IU/mL

## 2016-01-14 ENCOUNTER — Ambulatory Visit (INDEPENDENT_AMBULATORY_CARE_PROVIDER_SITE_OTHER): Payer: Self-pay | Admitting: Osteopathic Medicine

## 2016-01-14 VITALS — BP 98/60 | HR 66 | Temp 98.0°F | Resp 16 | Ht 63.0 in | Wt 128.6 lb

## 2016-01-14 DIAGNOSIS — R300 Dysuria: Secondary | ICD-10-CM

## 2016-01-14 DIAGNOSIS — N3090 Cystitis, unspecified without hematuria: Secondary | ICD-10-CM

## 2016-01-14 LAB — POCT URINALYSIS DIP (MANUAL ENTRY)
BILIRUBIN UA: NEGATIVE
BILIRUBIN UA: NEGATIVE
Glucose, UA: NEGATIVE
Nitrite, UA: NEGATIVE
PROTEIN UA: NEGATIVE
SPEC GRAV UA: 1.01
Urobilinogen, UA: 0.2
pH, UA: 6

## 2016-01-14 LAB — POC MICROSCOPIC URINALYSIS (UMFC): MUCUS RE: ABSENT

## 2016-01-14 MED ORDER — CIPROFLOXACIN HCL 500 MG PO TABS: 500.0000 mg | ORAL_TABLET | Freq: Two times a day (BID) | ORAL | 0 refills | Status: DC

## 2016-01-14 NOTE — Patient Instructions (Addendum)
Please call us if you haven't heard back about your urine culture results in 3 days.    IF you received an x-ray today, you will receive an invoice from [facility]. Please contact [facility] at [phone] with questions or concerns regarding your invoice.   IF you received labwork today, you will receive an invoice from [facility]. Please contact [facility] at [phone] with questions or concerns regarding your invoice.   Our billing staff will not be able to assist you with questions regarding bills from these companies.  You will be contacted with the lab results as soon as they are available. The fastest way to get your results is to activate your My Chart account. Instructions are located on the last page of this paperwork. If you have not heard from [place] regarding the results in 2 weeks, please contact this office.      Urinary Tract Infection Urinary tract infections (UTIs) can develop anywhere along your urinary tract. Your urinary tract is your body's drainage system for removing wastes and extra water. Your urinary tract includes two kidneys, two ureters, a bladder, and a urethra. Your kidneys are a pair of bean-shaped organs. Each kidney is about the size of your fist. They are located below your ribs, one on each side of your spine. CAUSES Infections are caused by microbes, which are microscopic organisms, including fungi, viruses, and bacteria. These organisms are so small that they can only be seen through a microscope. Bacteria are the microbes that most commonly cause UTIs. SYMPTOMS  Symptoms of UTIs may vary by age and gender of the patient and by the location of the infection. Symptoms in young women typically include a frequent and intense urge to urinate and a painful, burning feeling in the bladder or urethra during urination. Older women and men are more likely to be tired, shaky, and weak and have muscle aches and abdominal  pain. A fever may mean the infection is in your kidneys. Other symptoms of a kidney infection include pain in your back or sides below the ribs, nausea, and vomiting. DIAGNOSIS To diagnose a UTI, your caregiver will ask you about your symptoms. Your caregiver will also ask you to provide a urine sample. The urine sample will be tested for bacteria and white blood cells. White blood cells are made by your body to help fight infection. TREATMENT  Typically, UTIs can be treated with medication. Because most UTIs are caused by a bacterial infection, they usually can be treated with the use of antibiotics. The choice of antibiotic and length of treatment depend on your symptoms and the type of bacteria causing your infection. HOME CARE INSTRUCTIONS  If you were prescribed antibiotics, take them exactly as your caregiver instructs you. Finish the medication even if you feel better after you have only taken some of the medication.  Drink enough water and fluids to keep your urine clear or pale yellow.  Avoid caffeine, tea, and carbonated beverages. They tend to irritate your bladder.  Empty your bladder often. Avoid holding urine for long periods of time.  Empty your bladder before and after sexual intercourse.  After a bowel movement, women should cleanse from front to back. Use each tissue only once. SEEK MEDICAL CARE IF:   You have back pain.  You develop a fever.  Your symptoms do not begin to resolve within 3 days. SEEK IMMEDIATE MEDICAL CARE IF:   You have severe back pain or lower abdominal pain.  You  develop chills.  You have nausea or vomiting.  You have continued burning or discomfort with urination. MAKE SURE YOU:   Understand these instructions.  Will watch your condition.  Will get help right away if you are not doing well or get worse.   This information is not intended to replace advice given to you by your health care provider. Make sure you discuss any questions  you have with your health care provider.   Document Released: 03/07/2005 Document Revised: 02/16/2015 Document Reviewed: 07/06/2011 Elsevier Interactive Patient Education [employer]

## 2016-01-14 NOTE — Progress Notes (Signed)
HPI: Morgan Glenn is a 35 y.o. Not Hispanic or Latino female  who presents to [facility] today, 01/14/16,  for chief complaint of:  Chief Complaint  Patient presents with  . Dysuria    x 1 week ago , freq. urination, says she has not noticed any blood,      . Quality: (+) urinary frequency and dysuria,  . Duration: 1 week+ . Modifying factors: Azo as below . Assoc signs/symptoms:  No fever/chills or flank pain. Not sexually active (husband "not here")- denies possibility of pregnancy     Past medical, surgical, social and family history reviewed: Past Medical History:  Diagnosis Date  . Anemia    Past Surgical History:  Procedure Laterality Date  . CESAREAN SECTION     Social History  Substance Use Topics  . Smoking status: Never Smoker  . Smokeless tobacco: Not on file  . Alcohol use No   Family History  Problem Relation Age of Onset  . Diabetes Father   . Diabetes Sister   . Hyperlipidemia Brother      Current medication list and allergy/intolerance information reviewed:   Current Outpatient Prescriptions  Medication Sig Dispense Refill  . Phenazopyridine HCl (AZO TABS PO) Take by mouth. Reported on 12/12/2015     No current facility-administered medications for this visit.    No Known Allergies    Review of Systems:  Constitutional:  No  fever, no chills,  HEENT: No  headache  Cardiac: No  chest pain  Gastrointestinal: No  abdominal pain, No  nausea, No  vomiting,    Genitourinary: No  incontinence, No  abnormal genital bleeding, No abnormal genital discharge   Exam:  BP 98/60 (BP Location: Left Arm, Patient Position: Sitting, Cuff Size: Normal)   Pulse 66   Temp 98 F (36.7 C) (Oral)   Resp 16   Ht  (1.6 m)   Wt 128 lb 9.6 oz (58.3 kg)   LMP 01/06/2016   SpO2 100%   BMI 22.78 kg/m   Constitutional: VS see above. General Appearance: alert, well-developed, well-nourished, NAD  Respiratory: Normal  respiratory effort.  Musculoskeletal: Gait normal. No clubbing/cyanosis of digits. (-)Lloyd's bilaterally  Psychiatric: Normal judgment/insight. Normal mood and affect. Oriented x3.    Results for orders placed or performed in visit on 01/14/16 (from the past 72 hour(s))  POCT urinalysis dipstick     Status: Abnormal   Collection Time: 01/14/16  9:07 AM  Result Value Ref Range   Color, UA yellow yellow   Clarity, UA cloudy (A) clear   Glucose, UA negative negative   Bilirubin, UA negative negative   Ketones, POC UA negative negative   Spec Grav, UA 1.010    Blood, UA moderate (A) negative   pH, UA 6.0    Protein Ur, POC negative negative   Urobilinogen, UA 0.2    Nitrite, UA Negative Negative   Leukocytes, UA large (3+) (A) Negative  POCT Microscopic Urinalysis ([facility])     Status: Abnormal   Collection Time: 01/14/16  9:07 AM  Result Value Ref Range   WBC,UR,HPF,POC Too numerous to count  (A) None WBC/hpf   RBC,UR,HPF,POC Few (A) None RBC/hpf   Bacteria Moderate (A) None, Too numerous to count   Mucus Absent Absent   Epithelial Cells, UR Per Microscopy Few (A) None, Too numerous to count cells/hpf    No results found.   ASSESSMENT/PLAN:   Cipro due to prolonged symptoms prior to seeking  treatment, Ucx pending, see pt instructions   Cystitis - Plan: ciprofloxacin (CIPRO) 500 MG tablet, Urine culture  Dysuria - Plan: POCT urinalysis dipstick, POCT Microscopic Urinalysis ([facility])     Visit summary with medication list and pertinent instructions was printed for patient to review. All questions at time of visit were answered - patient instructed to contact office with any additional concerns. ER/RTC precautions were reviewed with the patient. Follow-up plan: Return if symptoms worsen or fail to improve.   Note: pt asked to have printed results of TB test done in 12/2015 this was provided for her.

## 2016-01-16 LAB — URINE CULTURE

## 2016-03-16 ENCOUNTER — Encounter: Payer: Self-pay | Admitting: *Deleted

## 2016-03-16 ENCOUNTER — Ambulatory Visit (INDEPENDENT_AMBULATORY_CARE_PROVIDER_SITE_OTHER): Payer: Self-pay | Admitting: Physician Assistant

## 2016-03-16 VITALS — BP 100/66 | HR 65 | Temp 98.2°F | Resp 18 | Ht 63.0 in | Wt 130.0 lb

## 2016-03-16 DIAGNOSIS — N390 Urinary tract infection, site not specified: Secondary | ICD-10-CM

## 2016-03-16 DIAGNOSIS — R319 Hematuria, unspecified: Secondary | ICD-10-CM

## 2016-03-16 DIAGNOSIS — R300 Dysuria: Secondary | ICD-10-CM

## 2016-03-16 LAB — POCT URINALYSIS DIP (MANUAL ENTRY)
Bilirubin, UA: NEGATIVE
GLUCOSE UA: NEGATIVE
NITRITE UA: POSITIVE — AB
Protein Ur, POC: 100 — AB
Spec Grav, UA: 1.02
Urobilinogen, UA: 0.2
pH, UA: 5.5

## 2016-03-16 LAB — POC MICROSCOPIC URINALYSIS (UMFC): Mucus: ABSENT

## 2016-03-16 LAB — POCT WET + KOH PREP
Trich by wet prep: ABSENT
YEAST BY KOH: ABSENT
Yeast by wet prep: ABSENT

## 2016-03-16 MED ORDER — NITROFURANTOIN MONOHYD MACRO 100 MG PO CAPS: 100.0000 mg | ORAL_CAPSULE | Freq: Two times a day (BID) | ORAL | 0 refills | Status: AC

## 2016-03-16 NOTE — Patient Instructions (Addendum)
Take antibiotic as prescribed.  -Return to clinic if symptoms worsen, do not improve, or as needed    Preventing recurrent UTIs - Women with recurrent urinary tract infections may be advised to take steps to prevent bladder infections, including one or more of the following:  Cranberry products - Taking cranberry juice, cranberry tablets, or D-mannose has been promoted as one way to help prevent frequent bladder infections. However, several studies demonstrate no benefit with cranberry, and those studies showing that cranberry and D-mannose reduce the risk of recurrent bladder infections are not convincing.   Drinking more fluid and urinating after intercourse - Although studies have not proven that drinking more fluids or urinating soon after intercourse can prevent infection, some healthcare providers recommend these measures since they are not harmful. Drinking more fluid may help to wash out bacteria that enter the bladder.    Urinary Tract Infection Urinary tract infections (UTIs) can develop anywhere along your urinary tract. Your urinary tract is your body's drainage system for removing wastes and extra water. Your urinary tract includes two kidneys, two ureters, a bladder, and a urethra. Your kidneys are a pair of bean-shaped organs. Each kidney is about the size of your fist. They are located below your ribs, one on each side of your spine. CAUSES Infections are caused by microbes, which are microscopic organisms, including fungi, viruses, and bacteria. These organisms are so small that they can only be seen through a microscope. Bacteria are the microbes that most commonly cause UTIs. SYMPTOMS  Symptoms of UTIs may vary by age and gender of the patient and by the location of the infection. Symptoms in young women typically include a frequent and intense urge to urinate and a painful, burning feeling in the bladder or urethra during urination. Older women and men are more likely to be  tired, shaky, and weak and have muscle aches and abdominal pain. A fever may mean the infection is in your kidneys. Other symptoms of a kidney infection include pain in your back or sides below the ribs, nausea, and vomiting. DIAGNOSIS To diagnose a UTI, your caregiver will ask you about your symptoms. Your caregiver will also ask you to provide a urine sample. The urine sample will be tested for bacteria and white blood cells. White blood cells are made by your body to help fight infection. TREATMENT  Typically, UTIs can be treated with medication. Because most UTIs are caused by a bacterial infection, they usually can be treated with the use of antibiotics. The choice of antibiotic and length of treatment depend on your symptoms and the type of bacteria causing your infection. HOME CARE INSTRUCTIONS  If you were prescribed antibiotics, take them exactly as your caregiver instructs you. Finish the medication even if you feel better after you have only taken some of the medication.  Drink enough water and fluids to keep your urine clear or pale yellow.  Avoid caffeine, tea, and carbonated beverages. They tend to irritate your bladder.  Empty your bladder often. Avoid holding urine for long periods of time.  Empty your bladder before and after sexual intercourse.  After a bowel movement, women should cleanse from front to back. Use each tissue only once. SEEK MEDICAL CARE IF:   You have back pain.  You develop a fever.  Your symptoms do not begin to resolve within 3 days. SEEK IMMEDIATE MEDICAL CARE IF:   You have severe back pain or lower abdominal pain.  You develop chills.  You have  nausea or vomiting.  You have continued burning or discomfort with urination. MAKE SURE YOU:   Understand these instructions.  Will watch your condition.  Will get help right away if you are not doing well or get worse.   This information is not intended to replace advice given to you by your  health care provider. Make sure you discuss any questions you have with your health care provider.   Document Released: 03/07/2005 Document Revised: 02/16/2015 Document Reviewed: 07/06/2011 Elsevier Interactive Patient Education 2016 [employer].   IF you received an x-ray today, you will receive an invoice from [facility]. Please contact [facility] at [phone] with questions or concerns regarding your invoice.   IF you received labwork today, you will receive an invoice from [employer]. Please contact [facility] at [phone] with questions or concerns regarding your invoice.   Our billing staff will not be able to assist you with questions regarding bills from these companies.  You will be contacted with the lab results as soon as they are available. The fastest way to get your results is to activate your My Chart account. Instructions are located on the last page of this paperwork. If you have not heard from [place] regarding the results in 2 weeks, please contact this office.

## 2016-03-16 NOTE — Progress Notes (Signed)
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  SUBJECTIVE  Morgan Glenn is a 35 y.o. female who complains of dysuria, urinary frequency, flank pain, cloudy malordorous urine, vaginal discharge (described as yogurt appearance) and vaginal itching x 7 days. She denies hematuria, flank pain, abdominal pain and genital rash. Has tried azo with minimal relief. Most recent UTI prior to this was 01/14/2016. Pt sexually active with husband. In monogamous relationship with husband since 2004.  She  has a past medical history of Anemia.    Medications reviewed and updated by myself where necessary, and exist elsewhere in the encounter.   [name] has No Known Allergies. She  reports that she has never smoked. She has never used smokeless tobacco. She reports that she does not drink alcohol or use drugs. She  reports that she currently engages in sexual activity. The patient  has a past surgical history that includes Cesarean section.  Her family history includes Diabetes in her father and sister; Hyperlipidemia in her brother.  Review of Systems  Constitutional: Negative for chills and fever.  Gastrointestinal: Negative for diarrhea, nausea and vomiting.    OBJECTIVE  Her  height is 5\' 3"  (1.6 m) and weight is 130 lb (59 kg). Her oral temperature is 98.2 F (36.8 C). Her blood pressure is 100/66 and her pulse is 65. Her respiration is 18 and oxygen saturation is 100%.  The patient's body mass index is 23.03 kg/m.  Physical Exam  Constitutional: She is oriented to person, place, and time. She appears well-developed and well-nourished.  HENT:  Head: Normocephalic and atraumatic.   Eyes: Conjunctivae are normal.  Respiratory: Effort normal.  Genitourinary: Uterus normal. Cervix exhibits no motion tenderness. Right adnexum displays no mass and no tenderness. Left adnexum displays no tenderness. There is tenderness (at introitus) in the vagina. Vaginal discharge (creamy white ) found.  Neurological: She is alert and oriented to person, place, and time.  Skin: Skin is warm and dry.    Results for orders placed or performed in visit on 03/16/16 (from the past 24 hour(s))  POCT Microscopic Urinalysis ([facility])     Status: Abnormal   Collection Time: 03/16/16  9:59 AM  Result Value Ref Range   WBC,UR,HPF,POC Too numerous to count  (A) None WBC/hpf   RBC,UR,HPF,POC Too numerous to count  (A) None RBC/hpf   Bacteria Moderate (A) None, Too numerous to count   Mucus Absent Absent   Epithelial Cells, UR Per Microscopy None None, Too numerous to count cells/hpf  POCT urinalysis dipstick     Status: Abnormal   Collection Time: 03/16/16 10:00 AM  Result Value Ref Range   Color, UA yellow yellow   Clarity, UA cloudy (A) clear   Glucose, UA negative negative   Bilirubin, UA negative negative   Ketones, POC UA trace (5) (A) negative   Spec Grav, UA 1.020    Blood, UA moderate (A) negative   pH, UA 5.5    Protein Ur, POC =100 (A) negative   Urobilinogen, UA 0.2    Nitrite, UA Positive (A) Negative   Leukocytes, UA large (3+) (A) Negative  POCT Wet + KOH Prep     Status: None   Collection Time: 03/16/16 10:11 AM  Result Value Ref Range   Yeast by KOH Absent Present, Absent   Yeast by wet prep Absent Present, Absent   WBC by wet prep None None, Few, Too numerous to count   Clue Cells Wet Prep HPF POC None None, Too numerous to count   Trich by wet prep Absent Present, Absent   Bacteria Wet Prep HPF POC Few None, Few, Too numerous to count   Epithelial Cells By [employer] Pref ([facility]) Few None, Few, Too numerous to count   RBC,UR,HPF,POC None None RBC/hpf  Dictation  #1 [id number]  [id number]  CSN:[id number]   ASSESSMENT & PLAN  Morgan Glenn was seen today for dysuria and vaginal itching.  Diagnoses and all orders for this visit:  Dysuria -     POCT Microscopic Urinalysis ([facility]) -     POCT urinalysis dipstick -     Urine culture -     POCT Wet + KOH Prep  Urinary tract infection with hematuria, site unspecified -     nitrofurantoin, macrocrystal-monohydrate, (MACROBID) 100 MG capsule; Take 1 capsule (100 mg total) by mouth 2 (two) times daily.   The patient was advised to call or come back to clinic if she does not see an improvement in symptoms, or worsens with the above plan.   Benjiman CoreBrittany Wiseman, PA-C [facility] 03/16/2016 10:28 AM

## 2016-03-18 LAB — URINE CULTURE

## 2016-12-24 ENCOUNTER — Encounter: Payer: Self-pay | Admitting: Physician Assistant

## 2016-12-24 ENCOUNTER — Ambulatory Visit: Payer: Self-pay | Admitting: Physician Assistant

## 2016-12-24 ENCOUNTER — Ambulatory Visit (INDEPENDENT_AMBULATORY_CARE_PROVIDER_SITE_OTHER): Payer: No Typology Code available for payment source | Admitting: Physician Assistant

## 2016-12-24 VITALS — BP 107/62 | HR 76 | Temp 97.6°F | Resp 18 | Ht 63.0 in | Wt 140.0 lb

## 2016-12-24 DIAGNOSIS — N898 Other specified noninflammatory disorders of vagina: Secondary | ICD-10-CM

## 2016-12-24 DIAGNOSIS — R300 Dysuria: Secondary | ICD-10-CM

## 2016-12-24 DIAGNOSIS — B379 Candidiasis, unspecified: Secondary | ICD-10-CM | POA: Diagnosis not present

## 2016-12-24 LAB — POCT WET + KOH PREP: Trich by wet prep: ABSENT

## 2016-12-24 LAB — POCT URINALYSIS DIP (MANUAL ENTRY)
BILIRUBIN UA: NEGATIVE mg/dL
Bilirubin, UA: NEGATIVE
Glucose, UA: NEGATIVE mg/dL
NITRITE UA: NEGATIVE
PH UA: 6 (ref 5.0–8.0)
PROTEIN UA: NEGATIVE mg/dL
Spec Grav, UA: <=1.005 — AB (ref 1.010–1.025)
Urobilinogen, UA: 0.2 E.U./dL

## 2016-12-24 MED ORDER — FLUCONAZOLE 150 MG PO TABS: 150.0000 mg | ORAL_TABLET | Freq: Once | ORAL | 0 refills | Status: AC

## 2016-12-24 NOTE — Progress Notes (Signed)
[facility] [address], [place] [id number] [phone]  Date:  12/24/2016   Name:  Javier GlazierFatna Doo   DOB:  05/14/1981   MRN:  [id number]  PCP:  Patient, No Pcp Per    History of Present Illness:  Javier GlazierFatna Archuleta is a 36 y.o. female patient who presents to PCP with  Chief Complaint  Patient presents with  . Dysuria    some burning when urinating  . Vaginal Itching    since saturday      Patient has noticed vaginal itching for the last week.  It is extremely pruritic.  She has not noticed any rash or swelling.  She has noticed slight burning sensation when she is urinating.  None afterwards.  No current abdominal pain.  No nausea, fever, or back pain.   Vaginal discharge is yellow and has no odor.  She has a hx of vaginitis, yeast infections.    Patient Active Problem List   Diagnosis Date Noted  . Elderly multigravida with antepartum condition or complication 09/07/2013  . Vaginal bleeding in pregnancy 09/07/2013  . Previous cesarean section--FTD 09/07/2013  . 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 Hx successful VBAC (vaginal birth after cesarean), currently pregnant 09/07/2013  . Vitamin D deficiency 12/05/2009    Past Medical History:  Diagnosis Date  . Anemia     Past Surgical History:  Procedure Laterality Date  . CESAREAN SECTION      Social History  Substance Use Topics  . Smoking status: Never Smoker  . Smokeless tobacco: Never Used  . Alcohol use No    Family History  Problem Relation Age of Onset  . Diabetes Father   . Diabetes Sister   . Hyperlipidemia Brother     No Known Allergies  Medication list has been reviewed and updated.  No current outpatient prescriptions on file prior to visit.   No current facility-administered medications on file prior to visit.     ROS ROS otherwise unremarkable unless listed above.  Physical Examination: BP 107/62   Pulse 76   Temp 97.6 F (36.4 C) (Oral)   Resp  18   Ht 5\' 3"  (1.6 m)   Wt 140 lb (63.5 kg)   LMP 12/13/2016 (Approximate)   SpO2 95%   BMI 24.80 kg/m  Ideal Body Weight: Weight in (lb) to have BMI = 25: 140.8  Physical Exam  Constitutional: She is oriented to person, place, and time. She appears well-developed and well-nourished. No distress.  HENT:  Head: Normocephalic and atraumatic.  Right Ear: External ear normal.  Left Ear: External ear normal.  Eyes: Pupils are equal, round, and reactive to light. Conjunctivae and EOM are normal.  Cardiovascular: Normal rate.   Pulmonary/Chest: Effort normal. No respiratory distress.  Genitourinary: Pelvic exam was performed with patient supine. There is no rash on the right labia. There is no rash on the left labia. Cervix exhibits discharge and friability. Vaginal discharge found.  Neurological: She is alert and oriented to person, place, and time.  Skin: She is not diaphoretic.  Psychiatric: She has a normal mood and affect. Her behavior is normal.     Results for orders placed or performed in visit on 12/24/16  POCT urinalysis dipstick  Result Value Ref Range   Color, UA yellow yellow   Clarity, UA clear clear   Glucose, UA negative negative mg/dL   Bilirubin, UA negative negative   Ketones, POC UA negative negative mg/dL   Spec Grav,  UA <=1.005 (A) 1.010 - 1.025   Blood, UA trace-lysed (A) negative   pH, UA 6.0 5.0 - 8.0   Protein Ur, POC negative negative mg/dL   Urobilinogen, UA 0.2 0.2 or 1.0 E.U./dL   Nitrite, UA Negative Negative   Leukocytes, UA Small (1+) (A) Negative  POCT Wet + KOH Prep  Result Value Ref Range   Yeast by KOH Present (A) Absent   Yeast by wet prep Present (A) Absent   WBC by wet prep Many (A) Few   Clue Cells Wet Prep HPF POC None None   Trich by wet prep Absent Absent   Bacteria Wet Prep HPF POC Many (A) Few   Epithelial Cells By [employer] Pref ([facility]) Few None, Few, Too numerous to count   RBC,UR,HPF,POC Many (A) None RBC/hpf      Assessment and  Plan: Shaley Leavens is a 36 y.o. female who is here today for cc of abnormal vaginal discharge and dysuria. Will treat for yeast infection.  Labs pending.    Yeast infection  Dysuria - Plan: POCT urinalysis dipstick, Urine Culture, GC/Chlamydia Probe Amp  Vaginal discharge - Plan: POCT Wet + KOH Prep, fluconazole (DIFLUCAN) 150 MG tablet, GC/Chlamydia Probe Amp  Trena Platt, PA-C [facility] 7/18/20188:36 AM

## 2016-12-24 NOTE — Patient Instructions (Addendum)
I am the labs we discussed and will follow up with you regarding the results. You can try zinc oxide along the vaginal area if the vaginal area is irritated.   Vaginal Yeast infection, Adult Vaginal yeast infection is a condition that causes soreness, swelling, and redness (inflammation) of the vagina. It also causes vaginal discharge. This is a common condition. Some women get this infection frequently. What are the causes? This condition is caused by a change in the normal balance of the yeast (candida) and bacteria that live in the vagina. This change causes an overgrowth of yeast, which causes the inflammation. What increases the risk? This condition is more likely to develop in:  Women who take antibiotic medicines.  Women who have diabetes.  Women who take birth control pills.  Women who are pregnant.  Women who douche often.  Women who have a weak defense (immune) system.  Women who have been taking steroid medicines for a long time.  Women who frequently wear tight clothing.  What are the signs or symptoms? Symptoms of this condition include:  White, thick vaginal discharge.  Swelling, itching, redness, and irritation of the vagina. The lips of the vagina (vulva) may be affected as well.  Pain or a burning feeling while urinating.  Pain during sex.  How is this diagnosed? This condition is diagnosed with a medical history and physical exam. This will include a pelvic exam. Your health care provider will examine a sample of your vaginal discharge under a microscope. Your health care provider may send this sample for testing to confirm the diagnosis. How is this treated? This condition is treated with medicine. Medicines may be over-the-counter or prescription. You may be told to use one or more of the following:  Medicine that is taken orally.  Medicine that is applied as a cream.  Medicine that is inserted directly into the vagina (suppository).  Follow these  instructions at home:  Take or apply over-the-counter and prescription medicines only as told by your health care provider.  Do not have sex until your health care provider has approved. Tell your sex partner that you have a yeast infection. That person should go to his or her health care provider if he or she develops symptoms.  Do not wear tight clothes, such as pantyhose or tight pants.  Avoid using tampons until your health care provider approves.  Eat more yogurt. This may help to keep your yeast infection from returning.  Try taking a sitz bath to help with discomfort. This is a warm water bath that is taken while you are sitting down. The water should only come up to your hips and should cover your buttocks. Do this 3-4 times per day or as told by your health care provider.  Do not douche.  Wear breathable, cotton underwear.  If you have diabetes, keep your blood sugar levels under control. Contact a health care provider if:  You have a fever.  Your symptoms go away and then return.  Your symptoms do not get better with treatment.  Your symptoms get worse.  You have new symptoms.  You develop blisters in or around your vagina.  You have blood coming from your vagina and it is not your menstrual period.  You develop pain in your abdomen. This information is not intended to replace advice given to you by your health care provider. Make sure you discuss any questions you have with your health care provider. Document Released: 03/07/2005 Document Revised: 11/09/2015  Document Reviewed: 11/29/2014 Elsevier Interactive Patient Education  [employer]2018 Else[employer] Inc[employer]     IF you received an x-ray today, you will receive an invoice from [facility]. Please contact [facility] at [phone] with questions or concerns regarding your invoice.   IF you received labwork today, you will receive an invoice from [facility]. Please contact [facility] at [phone] with  questions or concerns regarding your invoice.   Our billing staff will not be able to assist you with questions regarding bills from these companies.  You will be contacted with the lab results as soon as they are available. The fastest way to get your results is to activate your My Chart account. Instructions are located on the last page of this paperwork. If you have not heard from us regarding the results in 2 weeks, please contact this office.

## 2016-12-25 LAB — URINE CULTURE: ORGANISM ID, BACTERIA: NO GROWTH

## 2016-12-27 LAB — GC/CHLAMYDIA PROBE AMP
Chlamydia trachomatis, NAA: NEGATIVE
Neisseria gonorrhoeae by PCR: NEGATIVE

## 2016-12-31 ENCOUNTER — Ambulatory Visit (INDEPENDENT_AMBULATORY_CARE_PROVIDER_SITE_OTHER): Payer: No Typology Code available for payment source | Admitting: Emergency Medicine

## 2016-12-31 ENCOUNTER — Ambulatory Visit (INDEPENDENT_AMBULATORY_CARE_PROVIDER_SITE_OTHER): Payer: No Typology Code available for payment source

## 2016-12-31 ENCOUNTER — Encounter: Payer: Self-pay | Admitting: Emergency Medicine

## 2016-12-31 VITALS — BP 96/62 | HR 88 | Temp 98.4°F | Resp 16 | Ht 63.0 in | Wt 140.8 lb

## 2016-12-31 DIAGNOSIS — S99922A Unspecified injury of left foot, initial encounter: Secondary | ICD-10-CM

## 2016-12-31 DIAGNOSIS — S92515A Nondisplaced fracture of proximal phalanx of left lesser toe(s), initial encounter for closed fracture: Secondary | ICD-10-CM

## 2016-12-31 HISTORY — DX: Nondisplaced fracture of proximal phalanx of left lesser toe(s), initial encounter for closed fracture: S92.515A

## 2016-12-31 NOTE — Patient Instructions (Addendum)
   IF you received an x-ray today, you will receive an invoice from [facility]. Please contact [facility] at [phone] with questions or concerns regarding your invoice.   IF you received labwork today, you will receive an invoice from [facility]. Please contact [facility] at [phone] with questions or concerns regarding your invoice.   Our billing staff will not be able to assist you with questions regarding bills from these companies.  You will be contacted with the lab results as soon as they are available. The fastest way to get your results is to activate your My Chart account. Instructions are located on the last page of this paperwork. If you have not heard from us regarding the results in 2 weeks, please contact this office.     Toe Fracture A toe fracture is a break in one of the toe bones (phalanges). Follow these instructions at home: If you have a cast:  Do not stick anything inside the cast to scratch your skin.  Check the skin around the cast every day. Tell your doctor about any concerns. Do not put lotion on the skin underneath the cast. You may put lotion on dry skin around the edges of the cast.  Do not put pressure on any part of the cast until it is fully hardened. This may take many hours.  Keep the cast clean and dry. Bathing  Do not take baths, swim, or use a hot tub until your doctor says that you can. Ask your doctor if you can take showers. You may only be allowed to take sponge baths for bathing.  If your doctor says that bathing and showering are okay, cover the cast or bandage (dressing) with a watertight plastic bag to protect it from water. Do not let the cast or bandage get wet. Managing pain, stiffness, and swelling  If you do not have a cast, put ice on the injured area if told by your doctor: ? Put ice in a plastic bag. ? Place a towel between your skin and the bag. ? Leave the ice on for 20 minutes, 2-3 times per  day.  Move your toes often to avoid stiffness and to lessen swelling.  Raise (elevate) the injured area above the level of your heart while you are sitting or lying down. Driving  Do not drive or use heavy machinery while taking pain medicine.  Do not drive while wearing a cast on a foot that you use for driving. Activity  Return to your normal activities as told by your doctor. Ask your doctor what activities are safe for you.  Perform exercises daily as told by your doctor or therapist. Safety  Do not use your leg to support your body weight until your doctor says that you can. Use crutches or other tools to help you move around as told by your doctor. General instructions  If your toe was taped to a toe that is next to it (buddy taping), follow your doctor's instructions for changing the gauze and tape. Change it more often: ? If the gauze and tape get wet. If this happens, dry the space between the toes. ? If the gauze and tape are too tight and they cause your toe to become pale or to lose feeling (numb).  Wear a protective shoe as told by your doctor. If you were not given one, wear sturdy shoes that support your foot. Your shoes should not pinch your toes. Your shoes should not fit tightly against   your toes.  Do not use any tobacco products, including cigarettes, chewing tobacco, or e-cigarettes. Tobacco can delay bone healing. If you need help quitting, ask your doctor.  Take medicines only as told by your doctor.  Keep all follow-up visits as told by your doctor. This is important. Contact a doctor if:  You have a fever.  Your pain medicine is not helping.  Your toe feels cold.  You lose feeling (have numbness) in your toe.  You still have pain after one week of rest and treatment.  You still have pain after your doctor has said that you can start walking again.  You have pain or tingling in your foot, and it is not going away.  You have loss of feeling in your  foot, and it is not going away. Get help right away if:  You have severe pain.  You have redness or swelling (inflammation) in your toe, and it is getting worse.  You have pain or loss of feeling in your toe, and it is getting worse.  Your toe is blue. This information is not intended to replace advice given to you by your health care provider. Make sure you discuss any questions you have with your health care provider. Document Released: 11/14/2007 Document Revised: 01/30/2016 Document Reviewed: 03/24/2014 Elsevier Interactive Patient Education  2018 Elsevier Inc.  

## 2016-12-31 NOTE — Progress Notes (Signed)
Morgan Glenn 36 y.o.   Chief Complaint  Patient presents with  . Toe Injury    PER PATIENT THINKS LEFT 2nd TOE 12/30/16    HISTORY OF PRESENT ILLNESS: This is a 36 y.o. female sustained toe injury yesterday; c/o pain and swelling.  HPI   Prior to Admission medications   Not on File    No Known Allergies  Patient Active Problem List   Diagnosis Date Noted  . Elderly multigravida with antepartum condition or complication 09/07/2013  . Vaginal bleeding in pregnancy 09/07/2013  . Previous cesarean section--FTD 09/07/2013  . 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 Hx successful VBAC (vaginal birth after cesarean), currently pregnant 09/07/2013  . Vitamin D deficiency 12/05/2009    Past Medical History:  Diagnosis Date  . Anemia     Past Surgical History:  Procedure Laterality Date  . CESAREAN SECTION      Social History   Social History  . Marital status: Married    Spouse name: N/A  . Number of children: N/A  . Years of education: N/A   Occupational History  . Not on file.   Social History Main Topics  . Smoking status: Never Smoker  . Smokeless tobacco: Never Used  . Alcohol use No  . Drug use: No  . Sexual activity: Yes   Other Topics Concern  . Not on file   Social History Narrative  . No narrative on file    Family History  Problem Relation Age of Onset  . Diabetes Father   . Diabetes Sister   . Hyperlipidemia Brother      Review of Systems  Constitutional: Negative for chills and fever.  Respiratory: Negative for shortness of breath.   Gastrointestinal: Negative for nausea and vomiting.  Musculoskeletal: Positive for joint pain.  Skin: Negative for rash.  Neurological: Negative for dizziness and headaches.  Endo/Heme/Allergies: Negative.   All other systems reviewed and are negative.  Vitals:   12/31/16 1149  BP: 96/62  Pulse: 88  Resp: 16  Temp: 98.4 F (36.9 C)     Physical Exam  Constitutional:  She is oriented to person, place, and time. She appears well-developed and well-nourished.  HENT:  Head: Normocephalic and atraumatic.  Eyes: Pupils are equal, round, and reactive to light. EOM are normal.  Neck: Normal range of motion. Neck supple.  Cardiovascular: Normal rate.   Pulmonary/Chest: Effort normal.  Musculoskeletal:  Left foot: +swelling and bruising to second toe and skin around it; NVI  Neurological: She is alert and oriented to person, place, and time. No sensory deficit. She exhibits normal muscle tone.  Skin: Skin is warm and dry. Capillary refill takes less than 2 seconds. No rash noted.  Psychiatric: She has a normal mood and affect. Her behavior is normal.  Vitals reviewed.  Dg Foot Complete Left  Result Date: 12/31/2016 CLINICAL DATA:  Patient with foot injury.  Pain.  Initial encounter. EXAM: LEFT FOOT - COMPLETE 3+ VIEW COMPARISON:  None. FINDINGS: There is a nondisplaced oblique fracture through the proximal phalanx of the second digit. Possible extension to the PIP joint. Overlying soft tissue swelling. No evidence for associated acute fractures. IMPRESSION: Nondisplaced oblique fracture through the proximal phalanx of the second digit. Possible extension to the PIP joint. Overlying soft tissue swelling. Electronically Signed   By: Annia Belt M.D.   On: 12/31/2016 12:31     ASSESSMENT & PLAN: [name] was seen today for toe injury.  Diagnoses and all orders  for this visit:  Closed nondisplaced fracture of proximal phalanx of lesser toe of left foot, initial encounter  Foot injury, left, initial encounter -     DG Foot Complete Left; Future    Patient Instructions       IF you received an x-ray today, you will receive an invoice from [facility]. Please contact [facility] at [phone] with questions or concerns regarding your invoice.   IF you received labwork today, you will receive an invoice from [facility]. Please contact [facility]  at [phone] with questions or concerns regarding your invoice.   Our billing staff will not be able to assist you with questions regarding bills from these companies.  You will be contacted with the lab results as soon as they are available. The fastest way to get your results is to activate your My Chart account. Instructions are located on the last page of this paperwork. If you have not heard from [place] regarding the results in 2 weeks, please contact this office.     Toe Fracture A toe fracture is a break in one of the toe bones (phalanges). Follow these instructions at home: If you have a cast:  Do not stick anything inside the cast to scratch your skin.  Check the skin around the cast every day. Tell your doctor about any concerns. Do not put lotion on the skin underneath the cast. You may put lotion on dry skin around the edges of the cast.  Do not put pressure on any part of the cast until it is fully hardened. This may take many hours.  Keep the cast clean and dry. Bathing  Do not take baths, swim, or use a hot tub until your doctor says that you can. Ask your doctor if you can take showers. You may only be allowed to take sponge baths for bathing.  If your doctor says that bathing and showering are okay, cover the cast or bandage (dressing) with a watertight plastic bag to protect it from water. Do not let the cast or bandage get wet. Managing pain, stiffness, and swelling  If you do not have a cast, put ice on the injured area if told by your doctor: ? Put ice in a plastic bag. ? Place a towel between your skin and the bag. ? Leave the ice on for 20 minutes, 2-3 times per day.  Move your toes often to avoid stiffness and to lessen swelling.  Raise (elevate) the injured area above the level of your heart while you are sitting or lying down. Driving  Do not drive or use heavy machinery while taking pain medicine.  Do not drive while wearing a cast on a foot that you  use for driving. Activity  Return to your normal activities as told by your doctor. Ask your doctor what activities are safe for you.  Perform exercises daily as told by your doctor or therapist. Safety  Do not use your leg to support your body weight until your doctor says that you can. Use crutches or other tools to help you move around as told by your doctor. General instructions  If your toe was taped to a toe that is next to it (buddy taping), follow your doctor's instructions for changing the gauze and tape. Change it more often: ? If the gauze and tape get wet. If this happens, dry the space between the toes. ? If the gauze and tape are too tight and they cause your toe to become  pale or to lose feeling (numb).  Wear a protective shoe as told by your doctor. If you were not given one, wear sturdy shoes that support your foot. Your shoes should not pinch your toes. Your shoes should not fit tightly against your toes.  Do not use any tobacco products, including cigarettes, chewing tobacco, or e-cigarettes. Tobacco can delay bone healing. If you need help quitting, ask your doctor.  Take medicines only as told by your doctor.  Keep all follow-up visits as told by your doctor. This is important. Contact a doctor if:  You have a fever.  Your pain medicine is not helping.  Your toe feels cold.  You lose feeling (have numbness) in your toe.  You still have pain after one week of rest and treatment.  You still have pain after your doctor has said that you can start walking again.  You have pain or tingling in your foot, and it is not going away.  You have loss of feeling in your foot, and it is not going away. Get help right away if:  You have severe pain.  You have redness or swelling (inflammation) in your toe, and it is getting worse.  You have pain or loss of feeling in your toe, and it is getting worse.  Your toe is blue. This information is not intended to replace  advice given to you by your health care provider. Make sure you discuss any questions you have with your health care provider. Document Released: 11/14/2007 Document Revised: 01/30/2016 Document Reviewed: 03/24/2014 Elsevier Interactive Patient Education  2018 Elsevier Inc.      Edwina BarthMiguel Shylie Polo, MD [facility]

## 2017-04-20 ENCOUNTER — Ambulatory Visit (INDEPENDENT_AMBULATORY_CARE_PROVIDER_SITE_OTHER): Payer: No Typology Code available for payment source | Admitting: Physician Assistant

## 2017-04-20 ENCOUNTER — Other Ambulatory Visit: Payer: Self-pay

## 2017-04-20 ENCOUNTER — Encounter: Payer: Self-pay | Admitting: Physician Assistant

## 2017-04-20 VITALS — BP 100/58 | HR 84 | Temp 98.3°F | Resp 16 | Ht 63.0 in | Wt 148.0 lb

## 2017-04-20 DIAGNOSIS — N941 Unspecified dyspareunia: Secondary | ICD-10-CM | POA: Diagnosis not present

## 2017-04-20 DIAGNOSIS — R350 Frequency of micturition: Secondary | ICD-10-CM | POA: Diagnosis not present

## 2017-04-20 LAB — POC MICROSCOPIC URINALYSIS (UMFC): MUCUS RE: ABSENT

## 2017-04-20 LAB — POCT URINE PREGNANCY: Preg Test, Ur: NEGATIVE

## 2017-04-20 LAB — POCT URINALYSIS DIP (MANUAL ENTRY)
Glucose, UA: 250 mg/dL — AB
NITRITE UA: POSITIVE — AB
PH UA: 5 (ref 5.0–8.0)
Protein Ur, POC: >=300 mg/dL — AB
Spec Grav, UA: <=1.005 — AB (ref 1.010–1.025)

## 2017-04-20 LAB — GLUCOSE, POCT (MANUAL RESULT ENTRY): POC GLUCOSE: 113 mg/dL — AB (ref 70–99)

## 2017-04-20 MED ORDER — NITROFURANTOIN MONOHYD MACRO 100 MG PO CAPS: 100.0000 mg | ORAL_CAPSULE | Freq: Two times a day (BID) | ORAL | 0 refills | Status: AC

## 2017-04-20 NOTE — Progress Notes (Signed)
urin

## 2017-04-20 NOTE — Progress Notes (Signed)
Patient ID: Javier GlazierFatna Pendergrass, female    DOB: 03/13/1981, 36 y.o.   MRN: [id number]  PCP: Patient, No Pcp Per  Chief Complaint  Patient presents with  . Urinary Tract Infection    burning, clear discharge, painful during sex     Subjective:   Presents for evaluation of urinary urgency frequency and burning..  From [place].  Pain with urination began yesterday. Whitish discharge is normal for her. No itching. Increased urinary frequency and urgency. OTC AZO helped.  Sexually active with one female partner. Uses condoms consistently. Sex has been painful- tenderness is located in the suprapubic region, not the vagina.  No fever, chills, nausea, vomiting, back pain. No increased fatigue, dizziness, no diaphoresis.  Review of Systems As above.    Patient Active Problem List   Diagnosis Date Noted  . Vitamin D deficiency 12/05/2009    No Known Allergies  Prior to Admission medications   Not on File     Past Medical, Surgical Family and Social History reviewed and updated.        Objective:  Physical Exam  Constitutional: She is oriented to person, place, and time. Vital signs are normal. She appears well-developed and well-nourished. No distress.  HENT:  Head: Normocephalic and atraumatic.  Cardiovascular: Normal rate, regular rhythm and normal heart sounds.  Pulmonary/Chest: Effort normal and breath sounds normal.  Abdominal: Soft. Normal appearance and bowel sounds are normal. She exhibits no distension and no mass. There is no hepatosplenomegaly. There is tenderness in the suprapubic area. There is no rigidity, no rebound, no guarding, no CVA tenderness, no tenderness at McBurney's point and negative Murphy's sign. No hernia.  Musculoskeletal: Normal range of motion.       Lumbar back: Normal.  Neurological: She is alert and oriented to person, place, and time.  Skin: Skin is warm and dry. No rash noted. She is not diaphoretic. No pallor.  Psychiatric: She  has a normal mood and affect. Her speech is normal and behavior is normal. Judgment normal.       Results for orders placed or performed in visit on 04/20/17  POCT urinalysis dipstick  Result Value Ref Range   Color, UA red (A) yellow   Clarity, UA cloudy (A) clear   Glucose, UA =250 (A) negative mg/dL   Bilirubin, UA moderate (A) negative   Ketones, POC UA small (15) (A) negative mg/dL   Spec Grav, UA <=4.098<=1.005 (A) 1.010 - 1.025   Blood, UA trace-intact (A) negative   pH, UA 5.0 5.0 - 8.0   Protein Ur, POC >=300 (A) negative mg/dL   Urobilinogen, UA >=1.1>=8.0 (A) 0.2 or 1.0 E.U./dL   Nitrite, UA Positive (A) Negative   Leukocytes, UA Large (3+) (A) Negative  POCT urine pregnancy  Result Value Ref Range   Preg Test, Ur Negative Negative  POCT glucose (manual entry)  Result Value Ref Range   POC Glucose 113 (A) 70 - 99 mg/dl  POCT Microscopic Urinalysis ([facility])  Result Value Ref Range   WBC,UR,HPF,POC Many (A) None WBC/hpf   RBC,UR,HPF,POC Few (A) None RBC/hpf   Bacteria Many (A) None, Too numerous to count   Mucus Absent Absent   Epithelial Cells, UR Per Microscopy Moderate (A) None, Too numerous to count cells/hpf       Assessment & Plan:  1. Urinary frequency 2. Dyspareunia, female I suspect that the dyspareunia is related to cystitis.  She is a low risk for sexually transmitted infection and her symptoms are not  consistent with yeast vaginitis or BV.  If symptoms persist after completion of antibiotic however, she will return for additional evaluation including pelvic exam. - POCT urinalysis dipstick - Urine Culture - POCT urine pregnancy - POCT glucose (manual entry) - POCT Microscopic Urinalysis ([facility]) - nitrofurantoin, macrocrystal-monohydrate, (MACROBID) 100 MG capsule; Take 1 capsule (100 mg total) 2 (two) times daily for 7 days by mouth.  Dispense: 14 capsule; Refill: 0     Return if symptoms worsen or fail to improve.   Fernande Brashelle S. Ranessa Kosta, PA-C Primary Care at  [facility]

## 2017-04-20 NOTE — Patient Instructions (Addendum)
Continue the Azo-Standard. Add the antibiotic twice daily for 7 days. Drink plenty of water every day. If your symptoms persist or if sex remains painful please return for reevaluation.    IF you received an x-ray today, you will receive an invoice from [facility]. Please contact [facility] at [phone] with questions or concerns regarding your invoice.   IF you received labwork today, you will receive an invoice from [facility]. Please contact [facility] at [phone] with questions or concerns regarding your invoice.   Our billing staff will not be able to assist you with questions regarding bills from these companies.  You will be contacted with the lab results as soon as they are available. The fastest way to get your results is to activate your My Chart account. Instructions are located on the last page of this paperwork. If you have not heard from us regarding the results in 2 weeks, please contact this office.

## 2017-04-21 LAB — URINE CULTURE

## 2017-04-24 ENCOUNTER — Ambulatory Visit: Payer: Self-pay | Admitting: *Deleted

## 2017-04-24 ENCOUNTER — Other Ambulatory Visit: Payer: Self-pay

## 2017-04-24 ENCOUNTER — Encounter: Payer: Self-pay | Admitting: Physician Assistant

## 2017-04-24 ENCOUNTER — Telehealth: Payer: Self-pay | Admitting: Physician Assistant

## 2017-04-24 ENCOUNTER — Ambulatory Visit (INDEPENDENT_AMBULATORY_CARE_PROVIDER_SITE_OTHER): Payer: No Typology Code available for payment source | Admitting: Physician Assistant

## 2017-04-24 VITALS — BP 90/52 | HR 80 | Temp 98.9°F | Resp 18 | Ht 63.0 in | Wt 146.6 lb

## 2017-04-24 DIAGNOSIS — R82998 Other abnormal findings in urine: Secondary | ICD-10-CM | POA: Diagnosis not present

## 2017-04-24 DIAGNOSIS — Z87440 Personal history of urinary (tract) infections: Secondary | ICD-10-CM

## 2017-04-24 DIAGNOSIS — D649 Anemia, unspecified: Secondary | ICD-10-CM | POA: Diagnosis not present

## 2017-04-24 DIAGNOSIS — R509 Fever, unspecified: Secondary | ICD-10-CM

## 2017-04-24 LAB — POC MICROSCOPIC URINALYSIS (UMFC): Mucus: ABSENT

## 2017-04-24 LAB — POCT CBC
GRANULOCYTE PERCENT: 60.2 % (ref 37–80)
HEMATOCRIT: 29.6 % — AB (ref 37.7–47.9)
HEMOGLOBIN: 9.5 g/dL — AB (ref 12.2–16.2)
Lymph, poc: 1.3 (ref 0.6–3.4)
MCH: 26.2 pg — AB (ref 27–31.2)
MCHC: 32.2 g/dL (ref 31.8–35.4)
MCV: 81.4 fL (ref 80–97)
MID (CBC): 0.8 (ref 0–0.9)
MPV: 8.7 fL (ref 0–99.8)
PLATELET COUNT, POC: 186 10*3/uL (ref 142–424)
POC GRANULOCYTE: 3.1 (ref 2–6.9)
POC LYMPH PERCENT: 24.2 %L (ref 10–50)
POC MID %: 15.6 %M — AB (ref 0–12)
RBC: 3.64 M/uL — AB (ref 4.04–5.48)
RDW, POC: 14.6 %
WBC: 5.2 10*3/uL (ref 4.6–10.2)

## 2017-04-24 LAB — POCT URINALYSIS DIP (MANUAL ENTRY)
BILIRUBIN UA: NEGATIVE
BILIRUBIN UA: NEGATIVE mg/dL
Glucose, UA: NEGATIVE mg/dL
Nitrite, UA: NEGATIVE
PH UA: 6.5 (ref 5.0–8.0)
PROTEIN UA: NEGATIVE mg/dL
SPEC GRAV UA: 1.01 (ref 1.010–1.025)
Urobilinogen, UA: 1 E.U./dL

## 2017-04-24 MED ORDER — FERROUS FUMARATE 325 (106 FE) MG PO TABS: 1.0000 | ORAL_TABLET | ORAL | 2 refills | Status: AC

## 2017-04-24 NOTE — Patient Instructions (Addendum)
You are slightly anemic, but it looks like what was causing the illness, is resolving.  I am rechecking your urine.  I would also like you to return with a urine tomorrow morning to just drop off. Urinate directly in the cup.  Do not wipe.  Then return it tomorrow.   I would also like you to start taking iron as prescribed.  I am getting iron panels to check this, however we can start taking the iron supplement.   Follow up in 1 week.    IF you received an x-ray today, you will receive an invoice from [facility]. Please contact [facility] at [phone] with questions or concerns regarding your invoice.   IF you received labwork today, you will receive an [facility]. Please contact [facility] at [phone] with questions or concerns regarding your invoice.   Our billing staff will not be able to assist you with questions regarding bills from these companies.  You will be contacted with the lab results as soon as they are available. The fastest way to get your results is to activate your My Chart account. Instructions are located on the last page of this paperwork. If you have not heard from us regarding the results in 2 weeks, please contact this office.

## 2017-04-24 NOTE — Telephone Encounter (Signed)
Pt seen in office on Saturday for cystitis and started on nitrofurantoin twice daily for 7 days; since she has been taking medication her symptoms for have gotten better. However, she states that since she has started taking medication she has been developing fevers with associated headaches, and upset stomach. The fevers started on Sunday and continue today.  Temperatures range from 99.8 - 100.8 degrees.  Reason for Disposition . [1] Taking antibiotic > 24 hours for UTI (urinary tract or bladder infection) AND [2] fever persists  Answer Assessment - Initial Assessment Questions 1. INFECTION: "What infection is the antibiotic being given for?"     cystitis 2. ANTIBIOTIC: "What antibiotic are you taking" "How many times per day?"     Nitrofurantoin twice daily 3. DURATION: "When was the antibiotic started?"     Saturday April 20, 2017 4. MAIN CONCERN OR SYMPTOM:  "What is your main concern right now?"     Fever with associated headache and upset stomach (no appetitie and nausea) 5. BETTER-SAME-WORSE: "Are you getting better, staying the same, or getting worse compared to when you first started the antibiotics?" If getting worse, ask: "In what way?"      Feels better 6. FEVER: "Do you have a fever?" If so, ask: "What is your temperature, how was it measured, and when did it start?"     Yes, 100.8 on Sunday at 6 pm; 99.8 today 0730  Forehead thermometer 7. SYMPTOMS: "Are there any other symptoms you're concerned about?" If so, ask: "When did it start?"     fever 8. FOLLOW-UP APPOINTMENT: "Do you have a follow-up appointment with your doctor?"     no  Protocols used: URINARY TRACT INFECTION ON ANTIBIOTIC FOLLOW-UP CALL - FEMALE-A-AH, INFECTION ON ANTIBIOTIC FOLLOW-UP CALL-A-AH

## 2017-04-24 NOTE — Progress Notes (Signed)
[facility] [address], [place] [id number] [phone]  Date:  04/24/2017   Name:  Morgan Glenn   DOB:  05/06/1981   MRN:  [id number]  PCP:  Patient, No Pcp Per    History of Present Illness:  Morgan Glenn is a 36 y.o. female patient who presents to PCP with  Chief Complaint  Patient presents with  . Fever    had a fever sunday,monday, and ended 6pm tuesday and was seen saturday for UTI but feels better with that issue      Patient returns today with complaint of fever and malaise.   She was seen at our clinic 4 days ago for symptoms of urinary frequency and dyspareunia.  She was treated with macrobid for uti with positive nitrites.   Mixed flora upon culture.  She reports that her urinary frequency and pelvic pain resolved, but then 3 days ago, reports a temperature of 100.8  In the morning 3 days ago and last fever was yesterday 6pm.  No dysuria, hematuria, or frequency.   She has some dizziness.  She is drinking a lot of water, >64oz per day.   She recalls a slight pain to her right 3 days ago, but none since.   No cough or congestion.  She has cold chills. She feels bloated.  No diarrhea.   She denies black or bloody stool She has an upset stomach which she states is around her antibiotic.    Patient Active Problem List   Diagnosis Date Noted  . Vitamin D deficiency 12/05/2009    Past Medical History:  Diagnosis Date  . Anemia   . Closed nondisplaced fracture of proximal phalanx of lesser toe of left foot 12/31/2016  . Elderly multigravida with antepartum condition or complication 09/07/2013  . 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 Hx successful VBAC (vaginal birth after cesarean), currently pregnant 09/07/2013  . Previous cesarean section--FTD 09/07/2013  . Vaginal bleeding in pregnancy 09/07/2013    Past Surgical History:  Procedure Laterality Date  . CESAREAN SECTION      Social History   Tobacco Use  . Smoking status:  Never Smoker  . Smokeless tobacco: Never Used  Substance Use Topics  . Alcohol use: No  . Drug use: No    Family History  Problem Relation Age of Onset  . Diabetes Father   . Diabetes Sister   . Hyperlipidemia Brother     No Known Allergies  Medication list has been reviewed and updated.  Current Outpatient Medications on File Prior to Visit  Medication Sig Dispense Refill  . nitrofurantoin, macrocrystal-monohydrate, (MACROBID) 100 MG capsule Take 1 capsule (100 mg total) 2 (two) times daily for 7 days by mouth. 14 capsule 0   No current facility-administered medications on file prior to visit.     ROS ROS otherwise unremarkable unless listed above.  Physical Examination: BP (!) 90/52   Pulse 80   Temp 98.9 F (37.2 C) (Oral)   Resp 18   Ht 5\' 3"  (1.6 m)   Wt 146 lb 9.6 oz (66.5 kg)   SpO2 99%   BMI 25.97 kg/m  Ideal Body Weight: Weight in (lb) to have BMI = 25: 140.8  Physical Exam  Results for orders placed or performed in visit on 04/24/17  POCT CBC  Result Value Ref Range   WBC 5.2 4.6 - 10.2 K/uL   Lymph, poc 1.3 0.6 - 3.4   POC LYMPH PERCENT 24.2 10 -  50 %L   MID (cbc) 0.8 0 - 0.9   POC MID % 15.6 (A) 0 - 12 %M   POC Granulocyte 3.1 2 - 6.9   Granulocyte percent 60.2 37 - 80 %G   RBC 3.64 (A) 4.04 - 5.48 M/uL   Hemoglobin 9.5 (A) 12.2 - 16.2 g/dL   HCT, POC 16.129.6 (A) 09.637.7 - 47.9 %   MCV 81.4 80 - 97 fL   MCH, POC 26.2 (A) 27 - 31.2 pg   MCHC 32.2 31.8 - 35.4 g/dL   RDW, POC 04.514.6 %   Platelet Count, POC 186 142 - 424 K/uL   MPV 8.7 0 - 99.8 fL  POCT urinalysis dipstick  Result Value Ref Range   Color, UA yellow yellow   Clarity, UA cloudy (A) clear   Glucose, UA negative negative mg/dL   Bilirubin, UA negative negative   Ketones, POC UA negative negative mg/dL   Spec Grav, UA 4.0981.010 1.1911.010 - 1.025   Blood, UA trace-intact (A) negative   pH, UA 6.5 5.0 - 8.0   Protein Ur, POC negative negative mg/dL   Urobilinogen, UA 1.0 0.2 or 1.0 E.U./dL    Nitrite, UA Negative Negative   Leukocytes, UA Large (3+) (A) Negative    Assessment and Plan: Morgan Glenn is a 36 y.o. female who is here today for cc of  Chief Complaint  Patient presents with  . Fever    had a fever sunday,monday, and ended 6pm tuesday and was seen saturday for UTI but feels better with that issue   started iron supplement.  Will obtain iron tibc in meantime.   Fever, unspecified fever cause - Plan: POCT CBC, POCT urinalysis dipstick, POCT Microscopic Urinalysis ([facility]), GC/Chlamydia Probe Amp, GC/Chlamydia Probe Amp  Recent urinary tract infection - Plan: POCT CBC, POCT urinalysis dipstick, POCT Microscopic Urinalysis ([facility]), GC/Chlamydia Probe Amp, GC/Chlamydia Probe Amp  Low hemoglobin - Plan: Iron, TIBC and Ferritin Panel  Leukocytes in urine - Plan: GC/Chlamydia Probe Amp, GC/Chlamydia Probe Amp  Trena PlattStephanie Adolf [place], PA-C [facility] 11/20/201811:09 AM

## 2017-04-24 NOTE — Telephone Encounter (Signed)
Charted in error.

## 2017-04-24 NOTE — Telephone Encounter (Signed)
Pt seen in office on Saturday for cystitis and started on nitrofurantoin twice daily for 7 days; since she has been taking medication her symptoms for have gotten better. However, she states that since she has started taking medication she has been developing fevers with associated headaches, and upset stomach. The fevers started on Sunday and continue today.  Temperatures range from 99.8 - 100.8 degrees. Will route to [place] to see if pt needs to be seen in office

## 2017-04-25 LAB — IRON,TIBC AND FERRITIN PANEL
FERRITIN: 47 ng/mL (ref 15–150)
IRON SATURATION: 15 % (ref 15–55)
Iron: 50 ug/dL (ref 27–159)
TIBC: 339 ug/dL (ref 250–450)
UIBC: 289 ug/dL (ref 131–425)

## 2017-04-25 NOTE — Telephone Encounter (Signed)
[facility].

## 2017-04-26 LAB — GC/CHLAMYDIA PROBE AMP
CHLAMYDIA, DNA PROBE: NEGATIVE
Neisseria gonorrhoeae by PCR: NEGATIVE

## 2017-04-26 NOTE — Telephone Encounter (Signed)
Yes, recommend re-evaluation of the NEW symptoms she is experiencing. Please schedule for re-evaluation.

## 2017-04-26 NOTE — Telephone Encounter (Signed)
Mychart message sent to pt about making an apt °

## 2017-04-29 ENCOUNTER — Telehealth: Payer: Self-pay | Admitting: Physician Assistant

## 2017-04-29 NOTE — Telephone Encounter (Signed)
Patient is calling to check and see how long she is to take the iron- her insurance is not wanting to pay for visits and she has had to cancel her follow up. Patient states she is doing much better with her UTI symptoms. Her fever is gone and she is feeling better. She has assured if her symptoms return she will call back. Told patient she is to take her iron for at least 3 months because that is what she has refills for. She states she will follow up in 3 months.

## 2017-04-30 ENCOUNTER — Other Ambulatory Visit: Payer: Self-pay | Admitting: Physician Assistant

## 2017-05-01 ENCOUNTER — Ambulatory Visit: Payer: No Typology Code available for payment source | Admitting: Physician Assistant

## 2017-09-18 ENCOUNTER — Encounter: Payer: Self-pay | Admitting: Physician Assistant

## 2018-03-05 IMAGING — DX DG FOOT COMPLETE 3+V*L*
3 series · 3 of 3 positions shown · non-contrast
Comparison: None.

CLINICAL DATA: Patient with foot injury.  Pain.  Initial encounter.

EXAM:
LEFT FOOT - COMPLETE 3+ VIEW

[foot ap]
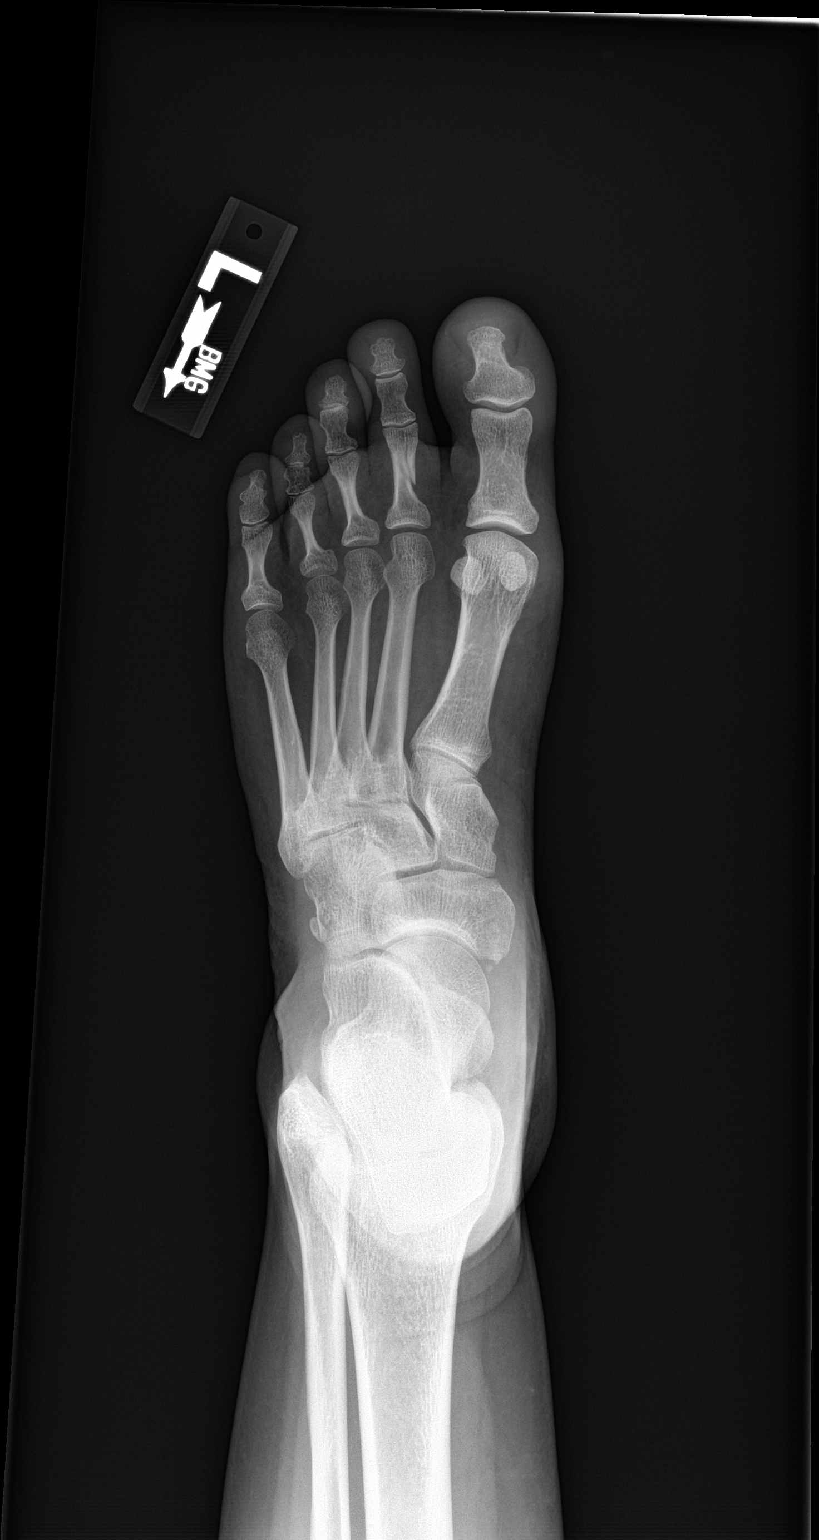

[foot obl]
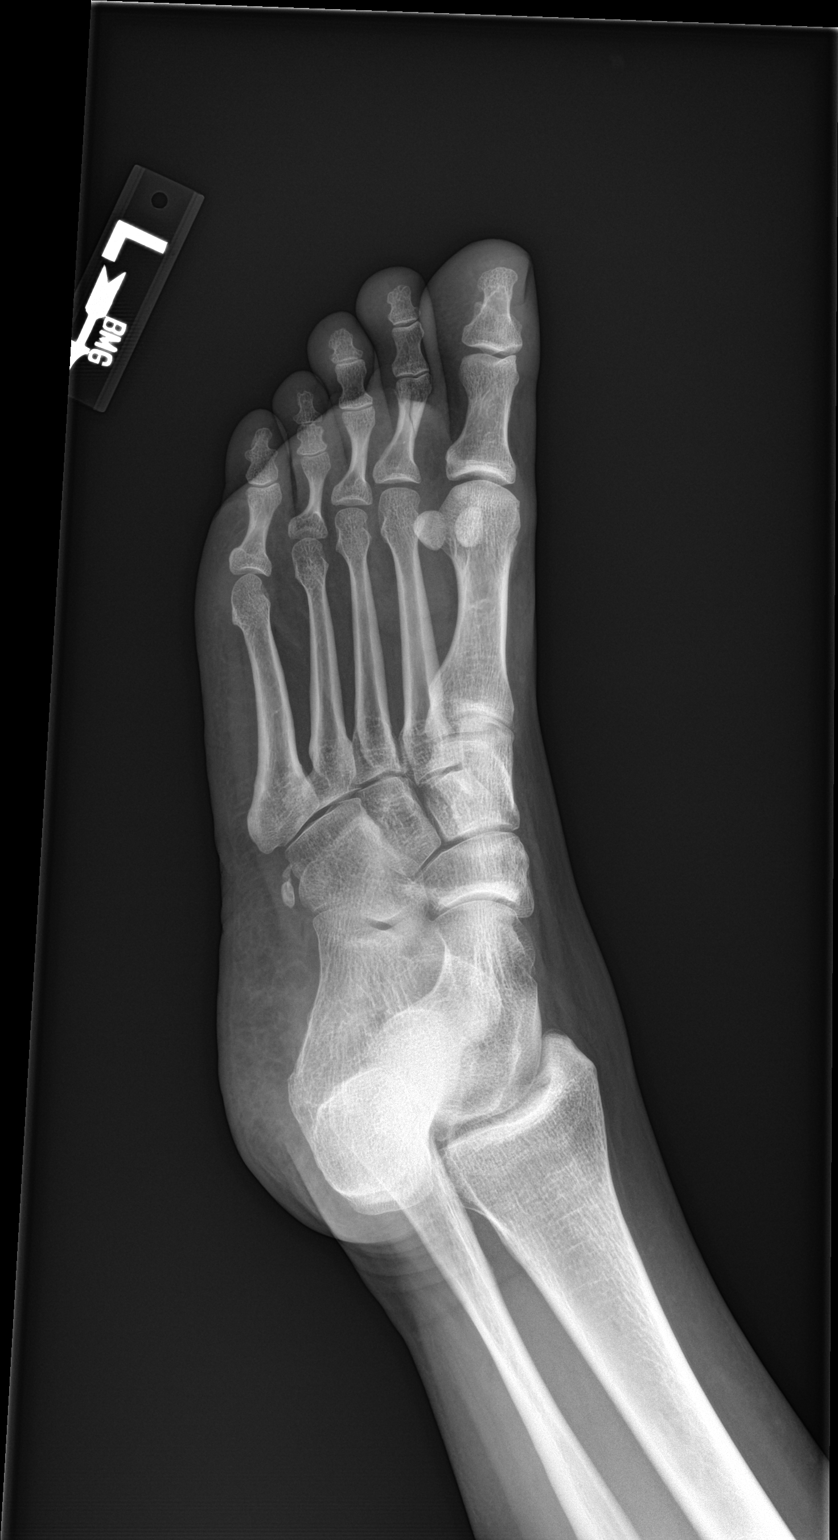

[foot lat]
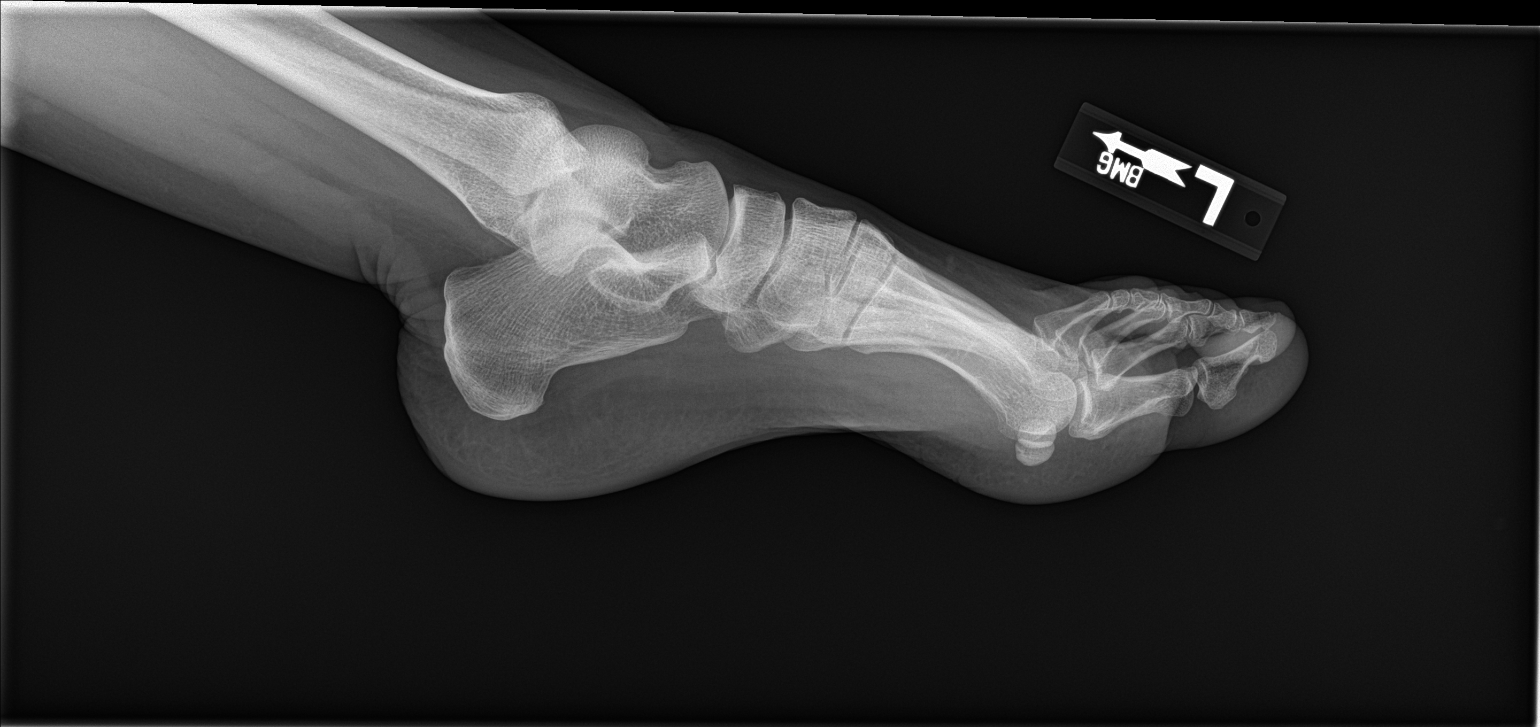

[3 of 3 positions shown; findings below may reference images not displayed]

FINDINGS: There is a nondisplaced oblique fracture through the proximal
phalanx of the second digit. Possible extension to the PIP joint.
Overlying soft tissue swelling. No evidence for associated acute
fractures.
IMPRESSION: Nondisplaced oblique fracture through the proximal phalanx of the
second digit. Possible extension to the PIP joint. Overlying soft
tissue swelling.

## 2018-04-17 ENCOUNTER — Emergency Department (HOSPITAL_COMMUNITY)
Admission: EM | Admit: 2018-04-17 | Discharge: 2018-04-17 | Disposition: A | Discharge status: Home / Self Care | Payer: Self-pay | Attending: Emergency Medicine | Admitting: Emergency Medicine

## 2018-04-17 ENCOUNTER — Encounter (HOSPITAL_COMMUNITY): Payer: Self-pay | Admitting: *Deleted

## 2018-04-17 DIAGNOSIS — M545 Low back pain: Secondary | ICD-10-CM | POA: Insufficient documentation

## 2018-04-17 DIAGNOSIS — R011 Cardiac murmur, unspecified: Secondary | ICD-10-CM | POA: Insufficient documentation

## 2018-04-17 LAB — COMPREHENSIVE METABOLIC PANEL
ALT: 16 U/L (ref 0–44)
ANION GAP: 9 (ref 5–15)
AST: 18 U/L (ref 15–41)
Albumin: 4.4 g/dL (ref 3.5–5.0)
Alkaline Phosphatase: 85 U/L (ref 38–126)
BUN: 8 mg/dL (ref 6–20)
CHLORIDE: 103 mmol/L (ref 98–111)
CO2: 25 mmol/L (ref 22–32)
Calcium: 8.8 mg/dL — ABNORMAL LOW (ref 8.9–10.3)
Creatinine, Ser: 0.49 mg/dL (ref 0.44–1.00)
GLUCOSE: 107 mg/dL — AB (ref 70–99)
POTASSIUM: 3.8 mmol/L (ref 3.5–5.1)
Sodium: 137 mmol/L (ref 135–145)
Total Bilirubin: 0.6 mg/dL (ref 0.3–1.2)
Total Protein: 8.1 g/dL (ref 6.5–8.1)

## 2018-04-17 LAB — CBC
HCT: 35.2 % — ABNORMAL LOW (ref 36.0–46.0)
Hemoglobin: 10.3 g/dL — ABNORMAL LOW (ref 12.0–15.0)
MCH: 24.4 pg — ABNORMAL LOW (ref 26.0–34.0)
MCHC: 29.3 g/dL — AB (ref 30.0–36.0)
MCV: 83.4 fL (ref 80.0–100.0)
NRBC: 0 % (ref 0.0–0.2)
PLATELETS: 254 10*3/uL (ref 150–400)
RBC: 4.22 MIL/uL (ref 3.87–5.11)
RDW: 17 % — ABNORMAL HIGH (ref 11.5–15.5)
WBC: 8.9 10*3/uL (ref 4.0–10.5)

## 2018-04-17 NOTE — Discharge Instructions (Addendum)
Follow up with primary care provider for further evaluation of your heart murmur. Your blood pressure today is in the normal range for you. Return to the ER for any worsening or concerning symptoms.

## 2018-04-17 NOTE — ED Triage Notes (Addendum)
Pt was sent from urgent care for hypotension and a heart murmer. Pt's BP was 98/65 at urgent care. Pt states the heart murmer is new. Pt went to urgent care for back pain and was prescribed methocarbamol.

## 2018-04-17 NOTE — ED Provider Notes (Signed)
[facility]-EMERGENCY DEPT Provider Note   CSN: [id number] Arrival date & time: 04/17/18  1227     History   Chief Complaint Chief Complaint  Patient presents with  . Hypotension    HPI Morgan Glenn is a 37 y.o. female.  37 year old female sent by [facility] for low blood pressure and new heart murmur. Patient went to urgent care today for right lower back pain, onset Saturday when she bent over to pick something up off the ground. Patient has been taking OTC meds at home with limited relief, continues to have pain with bending over. Pain does not radiate, no history of similar pain previously, denies recent injury/falls. Patient was given medication for her low back pain and sent to the ER for further evaluation. Patient denies feeling weak or dizzy, denies fevers or recent illness, chest pain, shortness of breath or any other complaints or concerns.      Past Medical History:  Diagnosis Date  . Anemia   . Closed nondisplaced fracture of proximal phalanx of lesser toe of left foot 12/31/2016  . Elderly multigravida with antepartum condition or complication 09/07/2013  . 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 Hx successful VBAC (vaginal birth after cesarean), currently pregnant 09/07/2013  . Previous cesarean section--FTD 09/07/2013  . Vaginal bleeding in pregnancy 09/07/2013    Patient Active Problem List   Diagnosis Date Noted  . Vitamin D deficiency 12/05/2009    Past Surgical History:  Procedure Laterality Date  . CESAREAN SECTION       OB History    Gravida  3   Para  2   Term  1   Preterm  1   AB  0   Living  2     SAB  0   TAB  0   Ectopic  0   Multiple  0   Live Births  1            Home Medications    Prior to Admission medications   Medication Sig Start Date End Date Taking? Authorizing Provider  acetaminophen (TYLENOL) 500 MG tablet Take 1,000 mg by mouth every 6 (six) hours as  needed for mild pain.   Yes [provider]  ibuprofen (ADVIL) 200 MG tablet Take 400 mg by mouth every 6 (six) hours as needed for mild pain.   Yes [provider]  ferrous fumarate (HEMOCYTE - 106 MG FE) 325 (106 Fe) MG TABS tablet Take 1 tablet (106 mg of iron total) every other day by mouth. Patient not taking: Reported on 04/17/2018 04/24/17   [name], PA    Family History Family History  Problem Relation Age of Onset  . Diabetes Father   . Diabetes Sister   . Hyperlipidemia Brother     Social History Social History   Tobacco Use  . Smoking status: Never Smoker  . Smokeless tobacco: Never Used  Substance Use Topics  . Alcohol use: No  . Drug use: No     Allergies   Patient has no known allergies.   Review of Systems Review of Systems  Constitutional: Negative for chills and fever.  Respiratory: Negative for shortness of breath.   Cardiovascular: Negative for chest pain and palpitations.  Gastrointestinal: Negative for abdominal pain, nausea and vomiting.  Musculoskeletal: Positive for back pain.  Allergic/Immunologic: Negative for immunocompromised state.  Neurological: Negative for dizziness, weakness and light-headedness.  Psychiatric/Behavioral: Negative for confusion.  All other  systems reviewed and are negative.    Physical Exam Updated Vital Signs BP (!) 97/50 (BP Location: Left Arm)   Pulse 69   Temp 98.5 F (36.9 C) (Oral)   Resp 12   SpO2 100%   Physical Exam  Constitutional: She is oriented to person, place, and time. She appears well-developed and well-nourished. No distress.  HENT:  Head: Normocephalic and atraumatic.  Cardiovascular: Normal rate, regular rhythm and intact distal pulses. Exam reveals no gallop and no friction rub.  Murmur heard.  Systolic murmur is present with a grade of 1/6. Pulmonary/Chest: Effort normal and breath sounds normal. No respiratory distress. She exhibits no tenderness.    Abdominal: Soft. She exhibits no distension. There is no tenderness.  Musculoskeletal: She exhibits tenderness. She exhibits no deformity.       Lumbar back: She exhibits no bony tenderness.       Back:  Neurological: She is alert and oriented to person, place, and time. She displays normal reflexes. No sensory deficit.  Skin: Skin is warm and dry. No rash noted. She is not diaphoretic.  Psychiatric: She has a normal mood and affect. Her behavior is normal.  Nursing note and vitals reviewed.    ED Treatments / Results  Labs (all labs ordered are listed, but only abnormal results are displayed) Labs Reviewed  CBC - Abnormal; Notable for the following components:      Result Value   Hemoglobin 10.3 (*)    HCT 35.2 (*)    MCH 24.4 (*)    MCHC 29.3 (*)    RDW 17.0 (*)    All other components within normal limits  COMPREHENSIVE METABOLIC PANEL - Abnormal; Notable for the following components:   Glucose, Bld 107 (*)    Calcium 8.8 (*)    All other components within normal limits    EKG EKG Interpretation  Date/Time:  Thursday April 17 2018 13:28:23 EST Ventricular Rate:  68 PR Interval:    QRS Duration: 91 QT Interval:  414 QTC Calculation: 441 R Axis:   -17 Text Interpretation:  Sinus rhythm Borderline left axis deviation Low voltage, precordial leads Consider anterior infarct Confirmed by Donnetta Hutching ([id number]) on 04/17/2018 4:20:56 PM   Radiology No results found.  Procedures Procedures (including critical care time)  Medications Ordered in ED Medications - No data to display   Initial Impression / Assessment and Plan / ED Course  I have reviewed the triage vital signs and the nursing notes.  Pertinent labs & imaging results that were available during my care of the patient were reviewed by me and considered in my medical decision making (see chart for details).  Clinical Course as of Apr 17 1624  Thu Apr 17, 2018  1224 37 year old female sent by urgent care  for concerns of low blood pressure and new diagnosis heart murmur.  Patient denies any complaints other than right lower back pain which started a few days ago after she bent down to pick something up.  Review of patient's prior vital signs, her blood pressure today is for her back to vitals recorded since 2013. Slight holosystolic S1 murmur, likely mitral regurgitation, recommend follow up with PCP for further evaluation. No further work up needed at this time.    [LM]    Clinical Course User Index [LM] Jeannie Fend, PA-C   Final Clinical Impressions(s) / ED Diagnoses   Final diagnoses:  Newly recognized heart murmur    ED Discharge Orders    None  Jeannie Fend, PA-C 04/17/18 1626    Donnetta Hutching, MD 04/18/18 [phone]
